# Patient Record
Sex: Female | Born: 1990 | Race: White | Hispanic: No | Marital: Married | Smoking: Former smoker
Health system: Southern US, Community
[De-identification: ages and names within clinical notes are randomized; demographics above are authoritative.]

## PROBLEM LIST (undated history)

## (undated) DIAGNOSIS — L409 Psoriasis, unspecified: Secondary | ICD-10-CM

## (undated) HISTORY — DX: Psoriasis, unspecified: L40.9

---
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
## 2014-06-17 ENCOUNTER — Encounter: Payer: Self-pay | Admitting: Adult Health

## 2014-06-17 ENCOUNTER — Ambulatory Visit (INDEPENDENT_AMBULATORY_CARE_PROVIDER_SITE_OTHER): Payer: 59 | Admitting: Adult Health

## 2014-06-17 DIAGNOSIS — Z3201 Encounter for pregnancy test, result positive: Secondary | ICD-10-CM

## 2014-06-17 LAB — POCT URINE PREGNANCY: Preg Test, Ur: POSITIVE

## 2014-06-18 ENCOUNTER — Other Ambulatory Visit: Payer: Self-pay | Admitting: Obstetrics and Gynecology

## 2014-06-18 DIAGNOSIS — O3680X0 Pregnancy with inconclusive fetal viability, not applicable or unspecified: Secondary | ICD-10-CM

## 2014-06-24 ENCOUNTER — Other Ambulatory Visit: Payer: 59

## 2014-06-30 ENCOUNTER — Ambulatory Visit (INDEPENDENT_AMBULATORY_CARE_PROVIDER_SITE_OTHER): Payer: 59

## 2014-06-30 DIAGNOSIS — O3680X0 Pregnancy with inconclusive fetal viability, not applicable or unspecified: Secondary | ICD-10-CM

## 2014-06-30 NOTE — Progress Notes (Signed)
U/S-single IUP with +FCA noted, FHR-157 bpm, CRL c/w 7+5wks, EDD 02/11/2015, cx appears closed, bilateral adnexa appears WNL, sub-chorionic hemorrhage 3.2 x 2.3 x 1.6cm noted in posterior uterus

## 2014-07-06 ENCOUNTER — Ambulatory Visit (INDEPENDENT_AMBULATORY_CARE_PROVIDER_SITE_OTHER): Payer: 59 | Admitting: Women's Health

## 2014-07-06 ENCOUNTER — Encounter: Payer: Self-pay | Admitting: Women's Health

## 2014-07-06 VITALS — BP 122/54 | Ht 62.0 in | Wt 138.0 lb

## 2014-07-06 DIAGNOSIS — Z6791 Unspecified blood type, Rh negative: Secondary | ICD-10-CM | POA: Insufficient documentation

## 2014-07-06 DIAGNOSIS — Z36 Encounter for antenatal screening of mother: Secondary | ICD-10-CM

## 2014-07-06 DIAGNOSIS — O26899 Other specified pregnancy related conditions, unspecified trimester: Secondary | ICD-10-CM

## 2014-07-06 DIAGNOSIS — O418X90 Other specified disorders of amniotic fluid and membranes, unspecified trimester, not applicable or unspecified: Secondary | ICD-10-CM | POA: Insufficient documentation

## 2014-07-06 DIAGNOSIS — Z3481 Encounter for supervision of other normal pregnancy, first trimester: Secondary | ICD-10-CM

## 2014-07-06 DIAGNOSIS — O360111 Maternal care for anti-D [Rh] antibodies, first trimester, fetus 1: Secondary | ICD-10-CM

## 2014-07-06 DIAGNOSIS — Z3480 Encounter for supervision of other normal pregnancy, unspecified trimester: Secondary | ICD-10-CM | POA: Insufficient documentation

## 2014-07-06 DIAGNOSIS — O468X9 Other antepartum hemorrhage, unspecified trimester: Secondary | ICD-10-CM | POA: Insufficient documentation

## 2014-07-06 DIAGNOSIS — Z1389 Encounter for screening for other disorder: Secondary | ICD-10-CM

## 2014-07-06 DIAGNOSIS — Z331 Pregnant state, incidental: Secondary | ICD-10-CM

## 2014-07-06 HISTORY — DX: Unspecified blood type, rh negative: Z67.91

## 2014-07-06 LAB — CBC
HCT: 40.3 % (ref 36.0–46.0)
Hemoglobin: 13.7 g/dL (ref 12.0–15.0)
MCH: 31.1 pg (ref 26.0–34.0)
MCHC: 34 g/dL (ref 30.0–36.0)
MCV: 91.4 fL (ref 78.0–100.0)
Platelets: 209 10*3/uL (ref 150–400)
RBC: 4.41 MIL/uL (ref 3.87–5.11)
RDW: 13 % (ref 11.5–15.5)
WBC: 7 10*3/uL (ref 4.0–10.5)

## 2014-07-06 LAB — POCT URINALYSIS DIPSTICK
GLUCOSE UA: NEGATIVE
Ketones, UA: NEGATIVE
LEUKOCYTES UA: NEGATIVE
Nitrite, UA: NEGATIVE
Protein, UA: NEGATIVE
RBC UA: NEGATIVE

## 2014-07-06 NOTE — Progress Notes (Signed)
  Subjective:  Alejandra Moody is a 23 y.o. 262P0010 Caucasian female at 3365w4d by 7.5wk u/s, being seen today for her first obstetrical visit.  Her obstetrical history is significant for recent SAB, Rh neg.  [facility] noted on 7wk u/s. Pregnancy history fully reviewed.  Patient reports mild nausea, no vomiting. Denies vb, cramping, uti s/s, abnormal/malodorous vag d/c, or vulvovaginal itching/irritation.  BP 122/54  Wt 138 lb (62.596 kg)  LMP 05/02/2014  HISTORY: OB History  Gravida Para Term Preterm AB SAB TAB Ectopic Multiple Living  2    1 1         # Outcome Date GA Lbr Len/2nd Weight Sex Delivery Anes PTL Lv  2 CUR           1 SAB 07/2013             Past Medical History  Diagnosis Date  . Psoriasis    Past Surgical History  Procedure Laterality Date  . Tonsillectomy and adenoidectomy  2004   Family History  Problem Relation Age of Onset  . Diabetes Paternal Grandmother   . Arthritis Maternal Grandmother     Exam   System:     General: Well developed & nourished, no acute distress   Skin: Warm & dry, normal coloration and turgor, no rashes   Neurologic: Alert & oriented, normal mood   Cardiovascular: Regular rate & rhythm   Respiratory: Effort & rate normal, LCTAB, acyanotic   Abdomen: Soft, non tender   Extremities: normal strength, tone   Pelvic Exam:    Perineum: Normal perineum   Vulva: Normal, no lesions   Vagina:  Normal mucosa, normal discharge   Cervix: Normal, bulbous, appears closed   Uterus: Normal size/shape/contour for GA   Thin prep pap smear Aug 2014 in [place], pt reports as normal  FHR: 160's via informal transabdominal u/s   Assessment:   Pregnancy: G2P0010 Patient Active Problem List   Diagnosis Date Noted  . Supervision of other normal pregnancy 07/06/2014    Priority: High  . 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 Rh  neg Recent SAB  Plan:  Initial labs drawn Continue prenatal vitamins Problem list reviewed and updated Reviewed n/v relief measures and warning s/s to report Reviewed recommended weight gain based on pre-gravid BMI Encouraged well-balanced diet Genetic Screening discussed Integrated Screen: requested Cystic fibrosis screening discussed requested Ultrasound discussed; fetal survey: requested Follow up in 4 weeks for 1st it/nt and low-risk ob [facility] completed Plans to apply for [facility] offered, accepted, referral sent  Marge DuncansBooker, Britanie Harshman Randall CNM, [facility]NP[facility]BC 07/06/2014 10:10 AM

## 2014-07-06 NOTE — Patient Instructions (Signed)
Nausea & Vomiting  Have saltine crackers or pretzels by your bed and eat a few bites before you raise your head out of bed in the morning  Eat small frequent meals throughout the day instead of large meals  Drink plenty of fluids throughout the day to stay hydrated, just don't drink a lot of fluids with your meals.  This can make your stomach fill up faster making you feel sick  Do not brush your teeth right after you eat  Products with real ginger are good for nausea, like ginger ale and ginger hard candy Make sure it says made with real ginger!  Sucking on sour candy like lemon heads is also good for nausea  If your prenatal vitamins make you nauseated, take them at night so you will sleep through the nausea  If you feel like you need medicine for the nausea & vomiting please let us know  If you are unable to keep any fluids or food down please let us know    Pregnancy - First Trimester During sexual intercourse, millions of sperm go into the vagina. Only 1 sperm will penetrate and fertilize the female egg while it is in the Fallopian tube. One week later, the fertilized egg implants into the wall of the uterus. An embryo begins to develop into a baby. At 6 to 8 weeks, the eyes and face are formed and the heartbeat can be seen on ultrasound. At the end of 12 weeks (first trimester), all the baby's organs are formed. Now that you are pregnant, you will want to do everything you can to have a healthy baby. Two of the most important things are to get good prenatal care and follow your caregiver's instructions. Prenatal care is all the medical care you receive before the baby's birth. It is given to prevent, find, and treat problems during the pregnancy and childbirth. PRENATAL EXAMS  During prenatal visits, your weight, blood pressure, and urine are checked. This is done to make sure you are healthy and progressing normally during the pregnancy.  A pregnant woman should gain 25 to 35 pounds  during the pregnancy. However, if you are overweight or underweight, your caregiver will advise you regarding your weight.  Your caregiver will ask and answer questions for you.  Blood work, cervical cultures, other necessary tests, and a Pap test are done during your prenatal exams. These tests are done to check on your health and the probable health of your baby. Tests are strongly recommended and done for HIV with your permission. This is the virus that causes AIDS. These tests are done because medicines can be given to help prevent your baby from being born with this infection should you have been infected without knowing it. Blood work is also used to find out your blood type, previous infections, and follow your blood levels (hemoglobin).  Low hemoglobin (anemia) is common during pregnancy. Iron and vitamins are given to help prevent this. Later in the pregnancy, blood tests for diabetes will be done along with any other tests if any problems develop.  You may need other tests to make sure you and the baby are doing well. CHANGES DURING THE FIRST TRIMESTER  Your body goes through many changes during pregnancy. They vary from person to person. Talk to your caregiver about changes you notice and are concerned about. Changes can include:  Your menstrual period stops.  The egg and sperm carry the genes that determine what you look like. Genes from you   and your partner are forming a baby. The female genes determine whether the baby is a boy or a girl.  Your body increases in girth and you may feel bloated.  Feeling sick to your stomach (nauseous) and throwing up (vomiting). If the vomiting is uncontrollable, call your caregiver.  Your breasts will begin to enlarge and become tender.  Your nipples may stick out more and become darker.  The need to urinate more. Painful urination may mean you have a bladder infection.  Tiring easily.  Loss of appetite.  Cravings for certain kinds of  food.  At first, you may gain or lose a couple of pounds.  You may have changes in your emotions from day to day (excited to be pregnant or concerned something may go wrong with the pregnancy and baby).  You may have more vivid and strange dreams. HOME CARE INSTRUCTIONS   It is very important to avoid all smoking, alcohol and non-prescribed drugs during your pregnancy. These affect the formation and growth of the baby. Avoid chemicals while pregnant to ensure the delivery of a healthy infant.  Start your prenatal visits by the 12th week of pregnancy. They are usually scheduled monthly at first, then more often in the last 2 months before delivery. Keep your caregiver's appointments. Follow your caregiver's instructions regarding medicine use, blood and lab tests, exercise, and diet.  During pregnancy, you are providing food for you and your baby. Eat regular, well-balanced meals. Choose foods such as meat, fish, milk and other low fat dairy products, vegetables, fruits, and whole-grain breads and cereals. Your caregiver will tell you of the ideal weight gain.  You can help morning sickness by keeping soda crackers at the bedside. Eat a couple before arising in the morning. You may want to use the crackers without salt on them.  Eating 4 to 5 small meals rather than 3 large meals a day also may help the nausea and vomiting.  Drinking liquids between meals instead of during meals also seems to help nausea and vomiting.  A physical sexual relationship may be continued throughout pregnancy if there are no other problems. Problems may be early (premature) leaking of amniotic fluid from the membranes, vaginal bleeding, or belly (abdominal) pain.  Exercise regularly if there are no restrictions. Check with your caregiver or physical therapist if you are unsure of the safety of some of your exercises. Greater weight gain will occur in the last 2 trimesters of pregnancy. Exercising will  help:  Control your weight.  Keep you in shape.  Prepare you for labor and delivery.  Help you lose your pregnancy weight after you deliver your baby.  Wear a good support or jogging bra for breast tenderness during pregnancy. This may help if worn during sleep too.  Ask when prenatal classes are available. Begin classes when they are offered.  Do not use hot tubs, steam rooms, or saunas.  Wear your seat belt when driving. This protects you and your baby if you are in an accident.  Avoid raw meat, uncooked cheese, cat litter boxes, and soil used by cats throughout the pregnancy. These carry germs that can cause birth defects in the baby.  The first trimester is a good time to visit your dentist for your dental health. Getting your teeth cleaned is okay. Use a softer toothbrush and brush gently during pregnancy.  Ask for help if you have financial, counseling, or nutritional needs during pregnancy. Your caregiver will be able to offer counseling for   these needs as well as refer you for other special needs.  Do not take any medicines or herbs unless told by your caregiver.  Inform your caregiver if there is any mental or physical domestic violence.  Make a list of emergency phone numbers of family, friends, hospital, and police and fire departments.  Write down your questions. Take them to your prenatal visit.  Do not douche.  Do not cross your legs.  If you have to stand for long periods of time, rotate you feet or take small steps in a circle.  You may have more vaginal secretions that may require a sanitary pad. Do not use tampons or scented sanitary pads. MEDICINES AND DRUG USE IN PREGNANCY  Take prenatal vitamins as directed. The vitamin should contain 1 milligram of folic acid. Keep all vitamins out of reach of children. Only a couple vitamins or tablets containing iron may be fatal to a baby or young child when ingested.  Avoid use of all medicines, including herbs,  over-the-counter medicines, not prescribed or suggested by your caregiver. Only take over-the-counter or prescription medicines for pain, discomfort, or fever as directed by your caregiver. Do not use aspirin, ibuprofen, or naproxen unless directed by your caregiver.  Let your caregiver also know about herbs you may be using.  Alcohol is related to a number of birth defects. This includes fetal alcohol syndrome. All alcohol, in any form, should be avoided completely. Smoking will cause low birth rate and premature babies.  Street or illegal drugs are very harmful to the baby. They are absolutely forbidden. A baby born to an addicted mother will be addicted at birth. The baby will go through the same withdrawal an adult does.  Let your caregiver know about any medicines that you have to take and for what reason you take them. SEEK MEDICAL CARE IF:  You have any concerns or worries during your pregnancy. It is better to call with your questions if you feel they cannot wait, rather than worry about them. SEEK IMMEDIATE MEDICAL CARE IF:   An unexplained oral temperature above 102 F (38.9 C) develops, or as your caregiver suggests.  You have leaking of fluid from the vagina (birth canal). If leaking membranes are suspected, take your temperature and inform your caregiver of this when you call.  There is vaginal spotting or bleeding. Notify your caregiver of the amount and how many pads are used.  You develop a bad smelling vaginal discharge with a change in the color.  You continue to feel sick to your stomach (nauseated) and have no relief from remedies suggested. You vomit blood or coffee ground-like materials.  You lose more than 2 pounds of weight in 1 week.  You gain more than 2 pounds of weight in 1 week and you notice swelling of your face, hands, feet, or legs.  You gain 5 pounds or more in 1 week (even if you do not have swelling of your hands, face, legs, or feet).  You get  exposed to German measles and have never had them.  You are exposed to fifth disease or chickenpox.  You develop belly (abdominal) pain. Round ligament discomfort is a common non-cancerous (benign) cause of abdominal pain in pregnancy. Your caregiver still must evaluate this.  You develop headache, fever, diarrhea, pain with urination, or shortness of breath.  You fall or are in a car accident or have any kind of trauma.  There is mental or physical violence in your home. Document   Released: 12/04/2001 Document Revised: 09/03/2012 Document Reviewed: 10/20/2013 ExitCare Patient Information 2015 [employer]. This information is not intended to replace advice given to you by your health care provider. Make sure you discuss any questions you have with your health care provider.  

## 2014-07-07 ENCOUNTER — Encounter: Payer: Self-pay | Admitting: Women's Health

## 2014-07-07 DIAGNOSIS — F1290 Cannabis use, unspecified, uncomplicated: Secondary | ICD-10-CM | POA: Insufficient documentation

## 2014-07-07 LAB — URINALYSIS, ROUTINE W REFLEX MICROSCOPIC
Bilirubin Urine: NEGATIVE
Glucose, UA: NEGATIVE mg/dL
Hgb urine dipstick: NEGATIVE
Ketones, ur: NEGATIVE mg/dL
Leukocytes, UA: NEGATIVE
NITRITE: NEGATIVE
PH: 6 (ref 5.0–8.0)
Protein, ur: NEGATIVE mg/dL
SPECIFIC GRAVITY, URINE: 1.023 (ref 1.005–1.030)
Urobilinogen, UA: 0.2 mg/dL (ref 0.0–1.0)

## 2014-07-07 LAB — DRUG SCREEN, URINE, NO CONFIRMATION
AMPHETAMINE SCRN UR: NEGATIVE
BENZODIAZEPINES.: NEGATIVE
Barbiturate Quant, Ur: NEGATIVE
COCAINE METABOLITES: NEGATIVE
Creatinine,U: 158.5 mg/dL
Marijuana Metabolite: POSITIVE — AB
Methadone: NEGATIVE
Opiate Screen, Urine: NEGATIVE
Phencyclidine (PCP): NEGATIVE
Propoxyphene: NEGATIVE

## 2014-07-07 LAB — HIV ANTIBODY (ROUTINE TESTING W REFLEX): HIV 1&2 Ab, 4th Generation: NONREACTIVE

## 2014-07-07 LAB — VARICELLA ZOSTER ANTIBODY, IGG: Varicella IgG: 430.9 Index — ABNORMAL HIGH (ref <135.00)

## 2014-07-07 LAB — OXYCODONE SCREEN, UA, RFLX CONFIRM: Oxycodone Screen, Ur: NEGATIVE ng/mL

## 2014-07-07 LAB — ABO AND RH: RH TYPE: NEGATIVE

## 2014-07-07 LAB — RPR

## 2014-07-07 LAB — RUBELLA SCREEN: Rubella: 2.73 Index — ABNORMAL HIGH (ref <0.90)

## 2014-07-07 LAB — ANTIBODY SCREEN: Antibody Screen: NEGATIVE

## 2014-07-07 LAB — HEPATITIS B SURFACE ANTIGEN: Hepatitis B Surface Ag: NEGATIVE

## 2014-07-08 LAB — URINE CULTURE
Colony Count: NO GROWTH
ORGANISM ID, BACTERIA: NO GROWTH

## 2014-07-09 LAB — CYSTIC FIBROSIS DIAGNOSTIC STUDY

## 2014-07-12 ENCOUNTER — Encounter: Payer: Self-pay | Admitting: Women's Health

## 2014-08-06 ENCOUNTER — Ambulatory Visit (INDEPENDENT_AMBULATORY_CARE_PROVIDER_SITE_OTHER): Payer: 59 | Admitting: Obstetrics and Gynecology

## 2014-08-06 ENCOUNTER — Encounter: Payer: Self-pay | Admitting: Obstetrics and Gynecology

## 2014-08-06 ENCOUNTER — Ambulatory Visit (INDEPENDENT_AMBULATORY_CARE_PROVIDER_SITE_OTHER): Payer: 59

## 2014-08-06 VITALS — BP 118/70 | Wt 140.2 lb

## 2014-08-06 DIAGNOSIS — Z36 Encounter for antenatal screening of mother: Secondary | ICD-10-CM

## 2014-08-06 DIAGNOSIS — Z1389 Encounter for screening for other disorder: Secondary | ICD-10-CM

## 2014-08-06 DIAGNOSIS — Z331 Pregnant state, incidental: Secondary | ICD-10-CM

## 2014-08-06 DIAGNOSIS — Z3480 Encounter for supervision of other normal pregnancy, unspecified trimester: Secondary | ICD-10-CM

## 2014-08-06 DIAGNOSIS — Z3481 Encounter for supervision of other normal pregnancy, first trimester: Secondary | ICD-10-CM

## 2014-08-06 LAB — POCT URINALYSIS DIPSTICK
Blood, UA: NEGATIVE
Glucose, UA: NEGATIVE
KETONES UA: NEGATIVE
LEUKOCYTES UA: NEGATIVE
NITRITE UA: NEGATIVE
Protein, UA: NEGATIVE

## 2014-08-06 NOTE — Patient Instructions (Signed)
Please check out [email or web address]   for more information on childbirth classes   

## 2014-08-06 NOTE — Progress Notes (Signed)
G2P0010 3871w0d Estimated Date of Delivery: 02/11/15  Blood pressure 118/70, weight 140 lb 3.2 oz (63.594 kg), last menstrual period 05/02/2014.   BP weight and urine results all reviewed and noted.  Please refer to the obstetrical flow sheet for the fundal height and fetal heart rate documentation:  Patient reports good fetal movement, denies any bleeding and no rupture of membranes symptoms or regular contractions. Patient is without complaints.All questions were answered.  Ob u/s reviewed with patient  Plan:  Continued routine obstetrical care,   Follow up in 4 weeks for OB appointment,

## 2014-08-06 NOTE — Progress Notes (Signed)
U/S(13+0wks)-single IUP with +FCA noted, FHR-161 bpm, NB present, NT-1.38mm, CRL c/w dates, cx appears closed (3.6cm), bilateral adnexa appears WNL, anterior Gr 0 placenta

## 2014-08-10 LAB — MATERNAL SCREEN, INTEGRATED #1

## 2014-09-06 ENCOUNTER — Ambulatory Visit (INDEPENDENT_AMBULATORY_CARE_PROVIDER_SITE_OTHER): Payer: 59 | Admitting: Women's Health

## 2014-09-06 ENCOUNTER — Encounter: Payer: Self-pay | Admitting: Women's Health

## 2014-09-06 VITALS — BP 108/56 | Wt 147.0 lb

## 2014-09-06 DIAGNOSIS — F1290 Cannabis use, unspecified, uncomplicated: Secondary | ICD-10-CM

## 2014-09-06 DIAGNOSIS — Z3480 Encounter for supervision of other normal pregnancy, unspecified trimester: Secondary | ICD-10-CM

## 2014-09-06 DIAGNOSIS — Z331 Pregnant state, incidental: Secondary | ICD-10-CM

## 2014-09-06 DIAGNOSIS — Z1159 Encounter for screening for other viral diseases: Secondary | ICD-10-CM

## 2014-09-06 DIAGNOSIS — Z1389 Encounter for screening for other disorder: Secondary | ICD-10-CM

## 2014-09-06 DIAGNOSIS — Z36 Encounter for antenatal screening of mother: Secondary | ICD-10-CM

## 2014-09-06 DIAGNOSIS — Z3482 Encounter for supervision of other normal pregnancy, second trimester: Secondary | ICD-10-CM

## 2014-09-06 LAB — POCT URINALYSIS DIPSTICK
Blood, UA: NEGATIVE
Glucose, UA: NEGATIVE
KETONES UA: NEGATIVE
Leukocytes, UA: NEGATIVE
Nitrite, UA: NEGATIVE
Protein, UA: NEGATIVE

## 2014-09-06 NOTE — Progress Notes (Signed)
Low-risk OB appointment G2P0010 [redacted]w[redacted]d Estimated Date of Delivery: 02/11/15 BP 108/56  Wt 147 lb (66.679 kg)  LMP 05/02/2014  BP, weight, and urine reviewed.  Refer to obstetrical flow sheet for FH & FHR.  No fm yet. Denies cramping, lof, vb, or uti s/s. No complaints. No THC in >30d, will recheck today.  Reviewed warning s/s to report. Plan:  Continue routine obstetrical care  F/U in 3wks for OB appointment and anatomy u/s 2nd IT today

## 2014-09-06 NOTE — Addendum Note (Signed)
Addended by: Gaylyn Rong A on: 09/06/2014 12:21 PM   Modules accepted: Orders

## 2014-09-06 NOTE — Patient Instructions (Signed)
Second Trimester of Pregnancy The second trimester is from week 13 through week 28, months 4 through 6. The second trimester is often a time when you feel your best. Your body has also adjusted to being pregnant, and you begin to feel better physically. Usually, morning sickness has lessened or quit completely, you may have more energy, and you may have an increase in appetite. The second trimester is also a time when the fetus is growing rapidly. At the end of the sixth month, the fetus is about 9 inches long and weighs about 1 pounds. You will likely begin to feel the baby move (quickening) between 18 and 20 weeks of the pregnancy. BODY CHANGES Your body goes through many changes during pregnancy. The changes vary from woman to woman.   Your weight will continue to increase. You will notice your lower abdomen bulging out.  You may begin to get stretch marks on your hips, abdomen, and breasts.  You may develop headaches that can be relieved by medicines approved by your health care provider.  You may urinate more often because the fetus is pressing on your bladder.  You may develop or continue to have heartburn as a result of your pregnancy.  You may develop constipation because certain hormones are causing the muscles that push waste through your intestines to slow down.  You may develop hemorrhoids or swollen, bulging veins (varicose veins).  You may have back pain because of the weight gain and pregnancy hormones relaxing your joints between the bones in your pelvis and as a result of a shift in weight and the muscles that support your balance.  Your breasts will continue to grow and be tender.  Your gums may bleed and may be sensitive to brushing and flossing.  Dark spots or blotches (chloasma, mask of pregnancy) may develop on your face. This will likely fade after the baby is born.  A dark line from your belly button to the pubic area (linea nigra) may appear. This will likely fade  after the baby is born.  You may have changes in your hair. These can include thickening of your hair, rapid growth, and changes in texture. Some women also have hair loss during or after pregnancy, or hair that feels dry or thin. Your hair will most likely return to normal after your baby is born. WHAT TO EXPECT AT YOUR PRENATAL VISITS During a routine prenatal visit:  You will be weighed to make sure you and the fetus are growing normally.  Your blood pressure will be taken.  Your abdomen will be measured to track your baby's growth.  The fetal heartbeat will be listened to.  Any test results from the previous visit will be discussed. Your health care provider may ask you:  How you are feeling.  If you are feeling the baby move.  If you have had any abnormal symptoms, such as leaking fluid, bleeding, severe headaches, or abdominal cramping.  If you have any questions. Other tests that may be performed during your second trimester include:  Blood tests that check for:  Low iron levels (anemia).  Gestational diabetes (between 24 and 28 weeks).  Rh antibodies.  Urine tests to check for infections, diabetes, or protein in the urine.  An ultrasound to confirm the proper growth and development of the baby.  An amniocentesis to check for possible genetic problems.  Fetal screens for spina bifida and Down syndrome. HOME CARE INSTRUCTIONS   Avoid all smoking, herbs, alcohol, and unprescribed   drugs. These chemicals affect the formation and growth of the baby.  Follow your health care provider's instructions regarding medicine use. There are medicines that are either safe or unsafe to take during pregnancy.  Exercise only as directed by your health care provider. Experiencing uterine cramps is a good sign to stop exercising.  Continue to eat regular, healthy meals.  Wear a good support bra for breast tenderness.  Do not use hot tubs, steam rooms, or saunas.  Wear your  seat belt at all times when driving.  Avoid raw meat, uncooked cheese, cat litter boxes, and soil used by cats. These carry germs that can cause birth defects in the baby.  Take your prenatal vitamins.  Try taking a stool softener (if your health care provider approves) if you develop constipation. Eat more high-fiber foods, such as fresh vegetables or fruit and whole grains. Drink plenty of fluids to keep your urine clear or pale yellow.  Take warm sitz baths to soothe any pain or discomfort caused by hemorrhoids. Use hemorrhoid cream if your health care provider approves.  If you develop varicose veins, wear support hose. Elevate your feet for 15 minutes, 3-4 times a day. Limit salt in your diet.  Avoid heavy lifting, wear low heel shoes, and practice good posture.  Rest with your legs elevated if you have leg cramps or low back pain.  Visit your dentist if you have not gone yet during your pregnancy. Use a soft toothbrush to brush your teeth and be gentle when you floss.  A sexual relationship may be continued unless your health care provider directs you otherwise.  Continue to go to all your prenatal visits as directed by your health care provider. SEEK MEDICAL CARE IF:   You have dizziness.  You have mild pelvic cramps, pelvic pressure, or nagging pain in the abdominal area.  You have persistent nausea, vomiting, or diarrhea.  You have a bad smelling vaginal discharge.  You have pain with urination. SEEK IMMEDIATE MEDICAL CARE IF:   You have a fever.  You are leaking fluid from your vagina.  You have spotting or bleeding from your vagina.  You have severe abdominal cramping or pain.  You have rapid weight gain or loss.  You have shortness of breath with chest pain.  You notice sudden or extreme swelling of your face, hands, ankles, feet, or legs.  You have not felt your baby move in over an hour.  You have severe headaches that do not go away with  medicine.  You have vision changes. Document Released: 12/04/2001 Document Revised: 12/15/2013 Document Reviewed: 02/10/2013 Exit[employer] Patient Information 2015 [employer]. This information is not intended to replace advice given to you by your health care provider. Make sure you discuss any questions you have with your health care provider.  

## 2014-09-07 ENCOUNTER — Encounter: Payer: Self-pay | Admitting: Women's Health

## 2014-09-07 LAB — DRUG SCREEN, URINE, NO CONFIRMATION
Amphetamine Screen, Ur: NEGATIVE
Barbiturate Quant, Ur: NEGATIVE
Benzodiazepines.: NEGATIVE
CREATININE, U: 98 mg/dL
Cocaine Metabolites: NEGATIVE
MARIJUANA METABOLITE: POSITIVE — AB
Methadone: NEGATIVE
Opiate Screen, Urine: NEGATIVE
PHENCYCLIDINE (PCP): NEGATIVE
Propoxyphene: NEGATIVE

## 2014-09-07 LAB — GC/CHLAMYDIA PROBE AMP
CT Probe RNA: NEGATIVE
GC Probe RNA: NEGATIVE

## 2014-09-10 LAB — MATERNAL SCREEN, INTEGRATED #2
AFP MoM: 1.35
AFP, Serum: 55.2 ng/mL
CALCULATED GESTATIONAL AGE MAT SCREEN: 17.6
Crown Rump Length: 71.7 mm
ESTRIOL FREE MAT SCREEN: 0.95 ng/mL
Estriol Mom: 0.77
HCG, MOM MAT SCREEN: 3.11
INHIBIN A DIMERIC MAT SCREEN: 746 pg/mL
INHIBIN A MOM MAT SCREEN: 4.47
MSS Down Syndrome: 1:310 {titer}
MSS Trisomy 18 Risk: <1:5000 {titer}
NT MoM: 1.08
Nuchal Translucency: 1.71 mm
Number of fetuses: 1
PAPP-A MoM: 0.78
PAPP-A: 626 ng/mL
hCG, Serum: 82.1 IU/mL

## 2014-09-13 ENCOUNTER — Encounter: Payer: Self-pay | Admitting: Women's Health

## 2014-09-27 ENCOUNTER — Ambulatory Visit (INDEPENDENT_AMBULATORY_CARE_PROVIDER_SITE_OTHER): Payer: 59

## 2014-09-27 ENCOUNTER — Encounter: Payer: Self-pay | Admitting: Women's Health

## 2014-09-27 ENCOUNTER — Ambulatory Visit (INDEPENDENT_AMBULATORY_CARE_PROVIDER_SITE_OTHER): Payer: 59 | Admitting: Women's Health

## 2014-09-27 ENCOUNTER — Other Ambulatory Visit: Payer: Self-pay | Admitting: Women's Health

## 2014-09-27 VITALS — BP 112/64 | Wt 151.0 lb

## 2014-09-27 DIAGNOSIS — Z23 Encounter for immunization: Secondary | ICD-10-CM

## 2014-09-27 DIAGNOSIS — Z3482 Encounter for supervision of other normal pregnancy, second trimester: Secondary | ICD-10-CM

## 2014-09-27 DIAGNOSIS — O09292 Supervision of pregnancy with other poor reproductive or obstetric history, second trimester: Secondary | ICD-10-CM

## 2014-09-27 DIAGNOSIS — Z1389 Encounter for screening for other disorder: Secondary | ICD-10-CM

## 2014-09-27 LAB — POCT URINALYSIS DIPSTICK
Blood, UA: NEGATIVE
Glucose, UA: NEGATIVE
Ketones, UA: NEGATIVE
Leukocytes, UA: NEGATIVE
Nitrite, UA: NEGATIVE
Protein, UA: NEGATIVE

## 2014-09-27 NOTE — Progress Notes (Signed)
Low-risk OB appointment G2P0010 7377w3d Estimated Date of Delivery: 02/11/15 BP 112/64  Wt 151 lb (68.493 kg)  LMP 05/02/2014  BP, weight, and urine reviewed.  Refer to obstetrical flow sheet for FH & FHR.  Reports some fm, has anterior placenta.  Denies regular uc's, lof, vb, or uti s/s. No complaints. Reviewed today's normal anatomy u/s, ptl s/s, fm. Plan:  Continue routine obstetrical care  F/U in 4wks for OB appointment  Flu shot today

## 2014-09-27 NOTE — Patient Instructions (Signed)
Second Trimester of Pregnancy The second trimester is from week 13 through week 28, months 4 through 6. The second trimester is often a time when you feel your best. Your body has also adjusted to being pregnant, and you begin to feel better physically. Usually, morning sickness has lessened or quit completely, you may have more energy, and you may have an increase in appetite. The second trimester is also a time when the fetus is growing rapidly. At the end of the sixth month, the fetus is about 9 inches long and weighs about 1 pounds. You will likely begin to feel the baby move (quickening) between 18 and 20 weeks of the pregnancy. BODY CHANGES Your body goes through many changes during pregnancy. The changes vary from woman to woman.   Your weight will continue to increase. You will notice your lower abdomen bulging out.  You may begin to get stretch marks on your hips, abdomen, and breasts.  You may develop headaches that can be relieved by medicines approved by your health care provider.  You may urinate more often because the fetus is pressing on your bladder.  You may develop or continue to have heartburn as a result of your pregnancy.  You may develop constipation because certain hormones are causing the muscles that push waste through your intestines to slow down.  You may develop hemorrhoids or swollen, bulging veins (varicose veins).  You may have back pain because of the weight gain and pregnancy hormones relaxing your joints between the bones in your pelvis and as a result of a shift in weight and the muscles that support your balance.  Your breasts will continue to grow and be tender.  Your gums may bleed and may be sensitive to brushing and flossing.  Dark spots or blotches (chloasma, mask of pregnancy) may develop on your face. This will likely fade after the baby is born.  A dark line from your belly button to the pubic area (linea nigra) may appear. This will likely fade  after the baby is born.  You may have changes in your hair. These can include thickening of your hair, rapid growth, and changes in texture. Some women also have hair loss during or after pregnancy, or hair that feels dry or thin. Your hair will most likely return to normal after your baby is born. WHAT TO EXPECT AT YOUR PRENATAL VISITS During a routine prenatal visit:  You will be weighed to make sure you and the fetus are growing normally.  Your blood pressure will be taken.  Your abdomen will be measured to track your baby's growth.  The fetal heartbeat will be listened to.  Any test results from the previous visit will be discussed. Your health care provider may ask you:  How you are feeling.  If you are feeling the baby move.  If you have had any abnormal symptoms, such as leaking fluid, bleeding, severe headaches, or abdominal cramping.  If you have any questions. Other tests that may be performed during your second trimester include:  Blood tests that check for:  Low iron levels (anemia).  Gestational diabetes (between 24 and 28 weeks).  Rh antibodies.  Urine tests to check for infections, diabetes, or protein in the urine.  An ultrasound to confirm the proper growth and development of the baby.  An amniocentesis to check for possible genetic problems.  Fetal screens for spina bifida and Down syndrome. HOME CARE INSTRUCTIONS   Avoid all smoking, herbs, alcohol, and unprescribed   drugs. These chemicals affect the formation and growth of the baby.  Follow your health care provider's instructions regarding medicine use. There are medicines that are either safe or unsafe to take during pregnancy.  Exercise only as directed by your health care provider. Experiencing uterine cramps is a good sign to stop exercising.  Continue to eat regular, healthy meals.  Wear a good support bra for breast tenderness.  Do not use hot tubs, steam rooms, or saunas.  Wear your  seat belt at all times when driving.  Avoid raw meat, uncooked cheese, cat litter boxes, and soil used by cats. These carry germs that can cause birth defects in the baby.  Take your prenatal vitamins.  Try taking a stool softener (if your health care provider approves) if you develop constipation. Eat more high-fiber foods, such as fresh vegetables or fruit and whole grains. Drink plenty of fluids to keep your urine clear or pale yellow.  Take warm sitz baths to soothe any pain or discomfort caused by hemorrhoids. Use hemorrhoid cream if your health care provider approves.  If you develop varicose veins, wear support hose. Elevate your feet for 15 minutes, 3-4 times a day. Limit salt in your diet.  Avoid heavy lifting, wear low heel shoes, and practice good posture.  Rest with your legs elevated if you have leg cramps or low back pain.  Visit your dentist if you have not gone yet during your pregnancy. Use a soft toothbrush to brush your teeth and be gentle when you floss.  A sexual relationship may be continued unless your health care provider directs you otherwise.  Continue to go to all your prenatal visits as directed by your health care provider. SEEK MEDICAL CARE IF:   You have dizziness.  You have mild pelvic cramps, pelvic pressure, or nagging pain in the abdominal area.  You have persistent nausea, vomiting, or diarrhea.  You have a bad smelling vaginal discharge.  You have pain with urination. SEEK IMMEDIATE MEDICAL CARE IF:   You have a fever.  You are leaking fluid from your vagina.  You have spotting or bleeding from your vagina.  You have severe abdominal cramping or pain.  You have rapid weight gain or loss.  You have shortness of breath with chest pain.  You notice sudden or extreme swelling of your face, hands, ankles, feet, or legs.  You have not felt your baby move in over an hour.  You have severe headaches that do not go away with  medicine.  You have vision changes. Document Released: 12/04/2001 Document Revised: 12/15/2013 Document Reviewed: 02/10/2013 [facility] Patient Information 2015 [place], [place]. This information is not intended to replace advice given to you by your health care provider. Make sure you discuss any questions you have with your health care provider.  Influenza Vaccine (Flu Vaccine, Inactivated or Recombinant) 2014-2015: What You Need to Know 1. Why get vaccinated? Influenza ("flu") is a contagious disease that spreads around the [place] every winter, usually between October and May. Flu is caused by influenza viruses, and is spread mainly by coughing, sneezing, and close contact. Anyone can get flu, but the risk of getting flu is highest among children. Symptoms come on suddenly and may last several days. They can include:  fever/chills  sore throat  muscle aches  fatigue  cough  headache  runny or stuffy nose Flu can make some people much sicker than others. These people include young children, people 3165 and older, pregnant women,  and people with certain health conditions-such as heart, lung or kidney disease, nervous system disorders, or a weakened immune system. Flu vaccination is especially important for these people, and anyone in close contact with them. Flu can also lead to pneumonia, and make existing medical conditions worse. It can cause diarrhea and seizures in children. Each year thousands of people in the [place] die from flu, and many more are hospitalized. Flu vaccine is the best protection against flu and its complications. Flu vaccine also helps prevent spreading flu from person to person. 2. Inactivated and recombinant flu vaccines You are getting an injectable flu vaccine, which is either an "inactivated" or "recombinant" vaccine. These vaccines do not contain any live influenza virus. They are given by injection with a needle, and often called the "flu shot."   A different live, attenuated (weakened) influenza vaccine is sprayed into the nostrils. This vaccine is described in a separate Vaccine Information Statement. Flu vaccination is recommended every year. Some children 6 months through 48 years of age might need two doses during one year. Flu viruses are always changing. Each year's flu vaccine is made to protect against 3 or 4 viruses that are likely to cause disease that year. Flu vaccine cannot prevent all cases of flu, but it is the best defense against the disease.  It takes about 2 weeks for protection to develop after the vaccination, and protection lasts several months to a year. Some illnesses that are not caused by influenza virus are often mistaken for flu. Flu vaccine will not prevent these illnesses. It can only prevent influenza. Some inactivated flu vaccine contains a very small amount of a mercury-based preservative called thimerosal. Studies have shown that thimerosal in vaccines is not harmful, but flu vaccines that do not contain a preservative are available. 3. Some people should not get this vaccine Tell the person who gives you the vaccine:  If you have any severe, life-threatening allergies. If you ever had a life-threatening allergic reaction after a dose of flu vaccine, or have a severe allergy to any part of this vaccine, including (for example) an allergy to gelatin, antibiotics, or eggs, you may be advised not to get vaccinated. Most, but not all, types of flu vaccine contain a small amount of egg protein.  If you ever had Guillain-Barr Syndrome (a severe paralyzing illness, also called GBS). Some people with a history of GBS should not get this vaccine. This should be discussed with your doctor.  If you are not feeling well. It is usually okay to get flu vaccine when you have a mild illness, but you might be advised to wait until you feel better. You should come back when you are better. 4. Risks of a vaccine reaction With  a vaccine, like any medicine, there is a chance of side effects. These are usually mild and go away on their own. Problems that could happen after any vaccine:  Brief fainting spells can happen after any medical procedure, including vaccination. Sitting or lying down for about 15 minutes can help prevent fainting, and injuries caused by a fall. Tell your doctor if you feel dizzy, or have vision changes or ringing in the ears.  Severe shoulder pain and reduced range of motion in the arm where a shot was given can happen, very rarely, after a vaccination.  Severe allergic reactions from a vaccine are very rare, estimated at less than 1 in a million doses. If one were to occur, it would usually be  within a few minutes to a few hours after the vaccination. Mild problems following inactivated flu vaccine:  soreness, redness, or swelling where the shot was given  hoarseness  sore, red or itchy eyes  cough  fever  aches  headache  itching  fatigue If these problems occur, they usually begin soon after the shot and last 1 or 2 days. Moderate problems following inactivated flu vaccine:  Young children who get inactivated flu vaccine and pneumococcal vaccine (PCV13) at the same time may be at increased risk for seizures caused by fever. Ask your doctor for more information. Tell your doctor if a child who is getting flu vaccine has ever had a seizure. Inactivated flu vaccine does not contain live flu virus, so you cannot get the flu from this vaccine. As with any medicine, there is a very remote chance of a vaccine causing a serious injury or death. The safety of vaccines is always being monitored. For more information, visit: http://floyd.org/ 5. What if there is a serious reaction? What should I look for?  Look for anything that concerns you, such as signs of a severe allergic reaction, very high fever, or behavior changes. Signs of a severe allergic reaction can include hives,  swelling of the face and throat, difficulty breathing, a fast heartbeat, dizziness, and weakness. These would start a few minutes to a few hours after the vaccination. What should I do?  If you think it is a severe allergic reaction or other emergency that can't wait, call 9-1-1 and get the person to the nearest hospital. Otherwise, call your doctor.  Afterward, the reaction should be reported to the Vaccine Adverse Event Reporting System (VAERS). Your doctor should file this report, or you can do it yourself through the VAERS web site at www.vaers.LAgents.no, or by calling 1-832-771-3265. VAERS does not give medical advice. 6. The National Vaccine Injury Compensation Program The [employer] Vaccine Injury Compensation Program (VICP) is a federal program that was created to compensate people who may have been injured by certain vaccines. Persons who believe they may have been injured by a vaccine can learn about the program and about filing a claim by calling 1-703-692-5060 or visiting the VICP website at SpiritualWord.at. There is a time limit to file a claim for compensation. 7. How can I learn more?  Ask your health care provider.  Call your local or state health department.  Contact the Centers for Disease Control and Prevention (CDC):  Call (678)462-5847 (1-800-CDC-INFO) or  Visit CDC's website at BiotechRoom.com.cy CDC Vaccine Information Statement (Interim) Inactivated Influenza Vaccine (08/11/2013) Document Released: 10/04/2006 Document Revised: 04/26/2014 Document Reviewed: 11/27/2013 [facility] Patient Information 2015 [place], [place]. This information is not intended to replace advice given to you by your health care provider. Make sure you discuss any questions you have with your health care provider.

## 2014-09-27 NOTE — Progress Notes (Signed)
U/S(20+3wks)-active fetus, meas c/w dates, fluid WNL, anterior Gr 0 placenta, cx appears closed (3.6cm), bilateral adnexa appears WNL, FHR-154bpm, female fetus, no major abnl noted

## 2014-10-25 ENCOUNTER — Encounter: Payer: Self-pay | Admitting: Women's Health

## 2014-10-25 ENCOUNTER — Ambulatory Visit (INDEPENDENT_AMBULATORY_CARE_PROVIDER_SITE_OTHER): Payer: 59 | Admitting: Women's Health

## 2014-10-25 VITALS — BP 112/64 | Wt 163.0 lb

## 2014-10-25 DIAGNOSIS — Z1389 Encounter for screening for other disorder: Secondary | ICD-10-CM

## 2014-10-25 DIAGNOSIS — Z331 Pregnant state, incidental: Secondary | ICD-10-CM

## 2014-10-25 DIAGNOSIS — Z3492 Encounter for supervision of normal pregnancy, unspecified, second trimester: Secondary | ICD-10-CM

## 2014-10-25 LAB — POCT URINALYSIS DIPSTICK
GLUCOSE UA: NEGATIVE
Ketones, UA: NEGATIVE
Leukocytes, UA: NEGATIVE
NITRITE UA: NEGATIVE
Protein, UA: NEGATIVE
RBC UA: NEGATIVE

## 2014-10-25 NOTE — Patient Instructions (Signed)
You will have your sugar test next visit.  Please do not eat or drink anything after midnight the night before you come, not even water.  You will be here for at least two hours.     Call the office [phone]) or go to [facility] if:  You begin to have strong, frequent contractions  Your water breaks.  Sometimes it is a big gush of fluid, sometimes it is just a trickle that keeps getting your panties wet or running down your legs  You have vaginal bleeding.  It is normal to have a small amount of spotting if your cervix was checked.   You don't feel your baby moving like normal.  If you don't, get you something to eat and drink and lay down and focus on feeling your baby move.  You should feel at least 10 movements in 2 hours.  If you don't, you should call the office or go to [facility].    Second Trimester of Pregnancy The second trimester is from week 13 through week 28, months 4 through 6. The second trimester is often a time when you feel your best. Your body has also adjusted to being pregnant, and you begin to feel better physically. Usually, morning sickness has lessened or quit completely, you may have more energy, and you may have an increase in appetite. The second trimester is also a time when the fetus is growing rapidly. At the end of the sixth month, the fetus is about 9 inches long and weighs about 1 pounds. You will likely begin to feel the baby move (quickening) between 18 and 20 weeks of the pregnancy. BODY CHANGES Your body goes through many changes during pregnancy. The changes vary from woman to woman.  5. Your weight will continue to increase. You will notice your lower abdomen bulging out. 6. You may begin to get stretch marks on your hips, abdomen, and breasts. 7. You may develop headaches that can be relieved by medicines approved by your health care provider. 8. You may urinate more often because the fetus is pressing on your bladder. 9. You may develop or  continue to have heartburn as a result of your pregnancy. 10. You may develop constipation because certain hormones are causing the muscles that push waste through your intestines to slow down. 11. You may develop hemorrhoids or swollen, bulging veins (varicose veins). 12. You may have back pain because of the weight gain and pregnancy hormones relaxing your joints between the bones in your pelvis and as a result of a shift in weight and the muscles that support your balance. 13. Your breasts will continue to grow and be tender. 14. Your gums may bleed and may be sensitive to brushing and flossing. 15. Dark spots or blotches (chloasma, mask of pregnancy) may develop on your face. This will likely fade after the baby is born. 16. A dark line from your belly button to the pubic area (linea nigra) may appear. This will likely fade after the baby is born. 17. You may have changes in your hair. These can include thickening of your hair, rapid growth, and changes in texture. Some women also have hair loss during or after pregnancy, or hair that feels dry or thin. Your hair will most likely return to normal after your baby is born. WHAT TO EXPECT AT YOUR PRENATAL VISITS During a routine prenatal visit:  You will be weighed to make sure you and the fetus are growing normally.  Your  blood pressure will be taken.  Your abdomen will be measured to track your baby's growth.  The fetal heartbeat will be listened to.  Any test results from the previous visit will be discussed. Your health care provider may ask you:  How you are feeling.  If you are feeling the baby move.  If you have had any abnormal symptoms, such as leaking fluid, bleeding, severe headaches, or abdominal cramping.  If you have any questions. Other tests that may be performed during your second trimester include:  Blood tests that check for:  Low iron levels (anemia).  Gestational diabetes (between 24 and 28 weeks).  Rh  antibodies.  Urine tests to check for infections, diabetes, or protein in the urine.  An ultrasound to confirm the proper growth and development of the baby.  An amniocentesis to check for possible genetic problems.  Fetal screens for spina bifida and Down syndrome. HOME CARE INSTRUCTIONS   Avoid all smoking, herbs, alcohol, and unprescribed drugs. These chemicals affect the formation and growth of the baby.  Follow your health care provider's instructions regarding medicine use. There are medicines that are either safe or unsafe to take during pregnancy.  Exercise only as directed by your health care provider. Experiencing uterine cramps is a good sign to stop exercising.  Continue to eat regular, healthy meals.  Wear a good support bra for breast tenderness.  Do not use hot tubs, steam rooms, or saunas.  Wear your seat belt at all times when driving.  Avoid raw meat, uncooked cheese, cat litter boxes, and soil used by cats. These carry germs that can cause birth defects in the baby.  Take your prenatal vitamins.  Try taking a stool softener (if your health care provider approves) if you develop constipation. Eat more high-fiber foods, such as fresh vegetables or fruit and whole grains. Drink plenty of fluids to keep your urine clear or pale yellow.  Take warm sitz baths to soothe any pain or discomfort caused by hemorrhoids. Use hemorrhoid cream if your health care provider approves.  If you develop varicose veins, wear support hose. Elevate your feet for 15 minutes, 3-4 times a day. Limit salt in your diet.  Avoid heavy lifting, wear low heel shoes, and practice good posture.  Rest with your legs elevated if you have leg cramps or low back pain.  Visit your dentist if you have not gone yet during your pregnancy. Use a soft toothbrush to brush your teeth and be gentle when you floss.  A sexual relationship may be continued unless your health care provider directs you  otherwise.  Continue to go to all your prenatal visits as directed by your health care provider. SEEK MEDICAL CARE IF:   You have dizziness.  You have mild pelvic cramps, pelvic pressure, or nagging pain in the abdominal area.  You have persistent nausea, vomiting, or diarrhea.  You have a bad smelling vaginal discharge.  You have pain with urination. SEEK IMMEDIATE MEDICAL CARE IF:   You have a fever.  You are leaking fluid from your vagina.  You have spotting or bleeding from your vagina.  You have severe abdominal cramping or pain.  You have rapid weight gain or loss.  You have shortness of breath with chest pain.  You notice sudden or extreme swelling of your face, hands, ankles, feet, or legs.  You have not felt your baby move in over an hour.  You have severe headaches that do not go away with  medicine.  You have vision changes. Document Released: 12/04/2001 Document Revised: 12/15/2013 Document Reviewed: 02/10/2013 [facility] Patient Information 2015 [place], [place]. This information is not intended to replace advice given to you by your health care provider. Make sure you discuss any questions you have with your health care provider.

## 2014-10-25 NOTE — Progress Notes (Signed)
Low-risk OB appointment [id number] Estimated Date of Delivery: 02/11/15 BP 112/64 mmHg  Wt 163 lb (73.936 kg)  LMP 05/02/2014  BP, weight, and urine reviewed.  Refer to obstetrical flow sheet for FH & FHR.  Reports good fm.  Denies regular uc's, lof, vb, or uti s/s. No complaints. [place]  Reviewed ptl s/s, fm. Plan:  Continue routine obstetrical care F/U in 4wks for OB appointment and pn2

## 2014-10-26 ENCOUNTER — Encounter: Payer: Self-pay | Admitting: Women's Health

## 2014-11-22 ENCOUNTER — Ambulatory Visit (INDEPENDENT_AMBULATORY_CARE_PROVIDER_SITE_OTHER): Payer: 59 | Admitting: Women's Health

## 2014-11-22 ENCOUNTER — Other Ambulatory Visit: Payer: 59

## 2014-11-22 ENCOUNTER — Encounter: Payer: Self-pay | Admitting: Women's Health

## 2014-11-22 VITALS — BP 104/64 | Wt 170.0 lb

## 2014-11-22 DIAGNOSIS — F1290 Cannabis use, unspecified, uncomplicated: Secondary | ICD-10-CM

## 2014-11-22 DIAGNOSIS — Z0184 Encounter for antibody response examination: Secondary | ICD-10-CM

## 2014-11-22 DIAGNOSIS — Z331 Pregnant state, incidental: Secondary | ICD-10-CM

## 2014-11-22 DIAGNOSIS — O418X90 Other specified disorders of amniotic fluid and membranes, unspecified trimester, not applicable or unspecified: Secondary | ICD-10-CM

## 2014-11-22 DIAGNOSIS — Z3483 Encounter for supervision of other normal pregnancy, third trimester: Secondary | ICD-10-CM

## 2014-11-22 DIAGNOSIS — Z114 Encounter for screening for human immunodeficiency virus [HIV]: Secondary | ICD-10-CM

## 2014-11-22 DIAGNOSIS — Z113 Encounter for screening for infections with a predominantly sexual mode of transmission: Secondary | ICD-10-CM

## 2014-11-22 DIAGNOSIS — Z1389 Encounter for screening for other disorder: Secondary | ICD-10-CM

## 2014-11-22 DIAGNOSIS — Z131 Encounter for screening for diabetes mellitus: Secondary | ICD-10-CM

## 2014-11-22 DIAGNOSIS — O468X9 Other antepartum hemorrhage, unspecified trimester: Secondary | ICD-10-CM

## 2014-11-22 LAB — CBC
HCT: 36.2 % (ref 36.0–46.0)
Hemoglobin: 12.2 g/dL (ref 12.0–15.0)
MCH: 30.9 pg (ref 26.0–34.0)
MCHC: 33.7 g/dL (ref 30.0–36.0)
MCV: 91.6 fL (ref 78.0–100.0)
MPV: 10.5 fL (ref 9.4–12.4)
Platelets: 181 10*3/uL (ref 150–400)
RBC: 3.95 MIL/uL (ref 3.87–5.11)
RDW: 12.8 % (ref 11.5–15.5)
WBC: 9.5 10*3/uL (ref 4.0–10.5)

## 2014-11-22 LAB — POCT URINALYSIS DIPSTICK
Blood, UA: NEGATIVE
Glucose, UA: NEGATIVE
Ketones, UA: NEGATIVE
Nitrite, UA: NEGATIVE
Protein, UA: NEGATIVE

## 2014-11-22 NOTE — Progress Notes (Signed)
Low-risk OB appointment [id number] Estimated Date of Delivery: 02/11/15 BP 104/64 mmHg  Wt 170 lb (77.111 kg)  LMP 05/02/2014  BP, weight, and urine reviewed.  Refer to obstetrical flow sheet for FH & FHR.  Reports good fm.  Denies regular uc's, lof, vb, or uti s/s. No complaints. No THC in >30d, will retest today.  Reviewed ptl s/s, fkc. Recommended Tdap at HD/PCP per CDC guidelines.  Plan:  Continue routine obstetrical care  F/U in 4wks for OB appointment  PN2 today

## 2014-11-22 NOTE — Patient Instructions (Addendum)
Call the office ([phone]) or go to [facility] if:  You begin to have strong, frequent contractions  Your water breaks.  Sometimes it is a big gush of fluid, sometimes it is just a trickle that keeps getting your panties wet or running down your legs  You have vaginal bleeding.  It is normal to have a small amount of spotting if your cervix was checked.   You don't feel your baby moving like normal.  If you don't, get you something to eat and drink and lay down and focus on feeling your baby move.  You should feel at least 10 movements in 2 hours.  If you don't, you should call the office or go to [facility].    Tdap Vaccine  It is recommended that you get the Tdap vaccine during the third trimester of EACH pregnancy to help protect your baby from getting pertussis (whooping cough)  27-36 weeks is the BEST time to do this so that you can pass the protection on to your baby. During pregnancy is better than after pregnancy, but if you are unable to get it during pregnancy it will be offered at the hospital.   You can get this vaccine at the health department or your family doctor  Everyone who will be around your baby should also be up-to-date on their vaccines. Adults (who are not pregnant) only need 1 dose of Tdap during adulthood.   [place] [facility]ians:  [facility] [phone]            [facility] [phone]                 [facility] [phone] (usually doesn't accept new patients unless you have family there already, you are always welcome to call and ask)             [facility] ([address] [place]) [phone]   [place] [facility]ians:   [facility]: [phone]  [facility]: [phone]    Third Trimester of Pregnancy The third trimester is from week 29 through week 42, months 7 through 9. The third trimester is a time when the fetus is  growing rapidly. At the end of the ninth month, the fetus is about 20 inches in length and weighs 6-10 pounds.  BODY CHANGES Your body goes through many changes during pregnancy. The changes vary from woman to woman.  9. Your weight will continue to increase. You can expect to gain 25-35 pounds (11-16 kg) by the end of the pregnancy. 10. You may begin to get stretch marks on your hips, abdomen, and breasts. 11. You may urinate more often because the fetus is moving lower into your pelvis and pressing on your bladder. 12. You may develop or continue to have heartburn as a result of your pregnancy. 13. You may develop constipation because certain hormones are causing the muscles that push waste through your intestines to slow down. 14. You may develop hemorrhoids or swollen, bulging veins (varicose veins). 15. You may have pelvic pain because of the weight gain and pregnancy hormones relaxing your joints between the bones in your pelvis. Backaches may result from overexertion of the muscles supporting your posture. 16. You may have changes in your hair. These can include thickening of your hair, rapid growth, and changes in texture. Some women also have hair loss during or after pregnancy, or hair that feels dry or thin. Your hair will most likely return to   normal after your baby is born. 17. Your breasts will continue to grow and be tender. A yellow discharge may leak from your breasts called colostrum. 18. Your belly button may stick out. 19. You may feel short of breath because of your expanding uterus. 20. You may notice the fetus "dropping," or moving lower in your abdomen. 21. You may have a bloody mucus discharge. This usually occurs a few days to a week before labor begins. 22. Your cervix becomes thin and soft (effaced) near your due date. WHAT TO EXPECT AT YOUR PRENATAL EXAMS  You will have prenatal exams every 2 weeks until week 36. Then, you will have weekly prenatal exams. During a  routine prenatal visit: 3. You will be weighed to make sure you and the fetus are growing normally. 4. Your blood pressure is taken. 5. Your abdomen will be measured to track your baby's growth. 6. The fetal heartbeat will be listened to. 7. Any test results from the previous visit will be discussed. 8. You may have a cervical check near your due date to see if you have effaced. At around 36 weeks, your caregiver will check your cervix. At the same time, your caregiver will also perform a test on the secretions of the vaginal tissue. This test is to determine if a type of bacteria, Group B streptococcus, is present. Your caregiver will explain this further. Your caregiver may ask you: 2. What your birth plan is. 3. How you are feeling. 4. If you are feeling the baby move. 5. If you have had any abnormal symptoms, such as leaking fluid, bleeding, severe headaches, or abdominal cramping. 6. If you have any questions. Other tests or screenings that may be performed during your third trimester include: 2. Blood tests that check for low iron levels (anemia). 3. Fetal testing to check the health, activity level, and growth of the fetus. Testing is done if you have certain medical conditions or if there are problems during the pregnancy. FALSE LABOR You may feel small, irregular contractions that eventually go away. These are called Braxton Hicks contractions, or false labor. Contractions may last for hours, days, or even weeks before true labor sets in. If contractions come at regular intervals, intensify, or become painful, it is best to be seen by your caregiver.  SIGNS OF LABOR  3. Menstrual-like cramps. 4. Contractions that are 5 minutes apart or less. 5. Contractions that start on the top of the uterus and spread down to the lower abdomen and back. 6. A sense of increased pelvic pressure or back pain. 7. A watery or bloody mucus discharge that comes from the vagina. If you have any of these  signs before the 37th week of pregnancy, call your caregiver right away. You need to go to the hospital to get checked immediately. HOME CARE INSTRUCTIONS   Avoid all smoking, herbs, alcohol, and unprescribed drugs. These chemicals affect the formation and growth of the baby.  Follow your caregiver's instructions regarding medicine use. There are medicines that are either safe or unsafe to take during pregnancy.  Exercise only as directed by your caregiver. Experiencing uterine cramps is a good sign to stop exercising.  Continue to eat regular, healthy meals.  Wear a good support bra for breast tenderness.  Do not use hot tubs, steam rooms, or saunas.  Wear your seat belt at all times when driving.  Avoid raw meat, uncooked cheese, cat litter boxes, and soil used by cats. These carry germs that can   cause birth defects in the baby.  Take your prenatal vitamins.  Try taking a stool softener (if your caregiver approves) if you develop constipation. Eat more high-fiber foods, such as fresh vegetables or fruit and whole grains. Drink plenty of fluids to keep your urine clear or pale yellow.  Take warm sitz baths to soothe any pain or discomfort caused by hemorrhoids. Use hemorrhoid cream if your caregiver approves.  If you develop varicose veins, wear support hose. Elevate your feet for 15 minutes, 3-4 times a day. Limit salt in your diet.  Avoid heavy lifting, wear low heal shoes, and practice good posture.  Rest a lot with your legs elevated if you have leg cramps or low back pain.  Visit your dentist if you have not gone during your pregnancy. Use a soft toothbrush to brush your teeth and be gentle when you floss.  A sexual relationship may be continued unless your caregiver directs you otherwise.  Do not travel far distances unless it is absolutely necessary and only with the approval of your caregiver.  Take prenatal classes to understand, practice, and ask questions about the  labor and delivery.  Make a trial run to the hospital.  Pack your hospital bag.  Prepare the baby's nursery.  Continue to go to all your prenatal visits as directed by your caregiver. SEEK MEDICAL CARE IF:  You are unsure if you are in labor or if your water has broken.  You have dizziness.  You have mild pelvic cramps, pelvic pressure, or nagging pain in your abdominal area.  You have persistent nausea, vomiting, or diarrhea.  You have a bad smelling vaginal discharge.  You have pain with urination. SEEK IMMEDIATE MEDICAL CARE IF:   You have a fever.  You are leaking fluid from your vagina.  You have spotting or bleeding from your vagina.  You have severe abdominal cramping or pain.  You have rapid weight loss or gain.  You have shortness of breath with chest pain.  You notice sudden or extreme swelling of your face, hands, ankles, feet, or legs.  You have not felt your baby move in over an hour.  You have severe headaches that do not go away with medicine.  You have vision changes. Document Released: 12/04/2001 Document Revised: 12/15/2013 Document Reviewed: 02/10/2013 ExitCare Patient Information 2015 [employer]. This information is not intended to replace advice given to you by your health care provider. Make sure you discuss any questions you have with your health care provider.   

## 2014-11-23 LAB — DRUG SCREEN, URINE, NO CONFIRMATION
Amphetamine Screen, Ur: NEGATIVE
BARBITURATE QUANT UR: NEGATIVE
BENZODIAZEPINES.: NEGATIVE
COCAINE METABOLITES: NEGATIVE
Creatinine,U: 48.1 mg/dL
METHADONE: NEGATIVE
Marijuana Metabolite: NEGATIVE
Opiate Screen, Urine: NEGATIVE
Phencyclidine (PCP): NEGATIVE
Propoxyphene: NEGATIVE

## 2014-11-23 LAB — ANTIBODY SCREEN: Antibody Screen: NEGATIVE

## 2014-11-23 LAB — RPR

## 2014-11-23 LAB — HSV 2 ANTIBODY, IGG: HSV 2 Glycoprotein G Ab, IgG: <0.1 IV

## 2014-11-23 LAB — GLUCOSE TOLERANCE, 2 HOURS W/ 1HR
Glucose, 1 hour: 127 mg/dL (ref 70–170)
Glucose, 2 hour: 107 mg/dL (ref 70–139)
Glucose, Fasting: 72 mg/dL (ref 70–99)

## 2014-11-23 LAB — HIV ANTIBODY (ROUTINE TESTING W REFLEX): HIV 1&2 Ab, 4th Generation: NONREACTIVE

## 2014-12-20 ENCOUNTER — Encounter: Payer: Self-pay | Admitting: Women's Health

## 2014-12-20 ENCOUNTER — Ambulatory Visit (INDEPENDENT_AMBULATORY_CARE_PROVIDER_SITE_OTHER): Payer: 59 | Admitting: Women's Health

## 2014-12-20 VITALS — BP 112/60 | Wt 177.5 lb

## 2014-12-20 DIAGNOSIS — Z1389 Encounter for screening for other disorder: Secondary | ICD-10-CM

## 2014-12-20 DIAGNOSIS — Z3493 Encounter for supervision of normal pregnancy, unspecified, third trimester: Secondary | ICD-10-CM

## 2014-12-20 DIAGNOSIS — O360131 Maternal care for anti-D [Rh] antibodies, third trimester, fetus 1: Secondary | ICD-10-CM

## 2014-12-20 DIAGNOSIS — O360991 Maternal care for other rhesus isoimmunization, unspecified trimester, fetus 1: Secondary | ICD-10-CM

## 2014-12-20 DIAGNOSIS — R82998 Other abnormal findings in urine: Secondary | ICD-10-CM

## 2014-12-20 DIAGNOSIS — Z331 Pregnant state, incidental: Secondary | ICD-10-CM

## 2014-12-20 DIAGNOSIS — Z3483 Encounter for supervision of other normal pregnancy, third trimester: Secondary | ICD-10-CM

## 2014-12-20 LAB — POCT URINALYSIS DIPSTICK
Blood, UA: NEGATIVE
Glucose, UA: NEGATIVE
Ketones, UA: NEGATIVE
Nitrite, UA: NEGATIVE

## 2014-12-20 MED ORDER — RHO D IMMUNE GLOBULIN 1500 UNIT/2ML IJ SOSY
300.0000 ug | PREFILLED_SYRINGE | Freq: Once | INTRAMUSCULAR | Status: AC
  Administered 2014-12-20: 300 ug via INTRAMUSCULAR

## 2014-12-20 NOTE — Patient Instructions (Signed)
Call the office [phone]) or [facility]:  You begin to have strong, frequent contractions  Your water breaks.  Sometimes it is a big gush of fluid, sometimes it is just a trickle that keeps getting your panties wet or running down your legs  You have vaginal bleeding.  It is normal to have a small amount of spotting if your cervix was checked.   You don't feel your baby moving like normal.  If you don't, get you something to eat and drink and lay down and focus on feeling your baby move.  You should feel at least 10 movements in 2 hours.  If you don't, you should call the office or [facility].    Rh0 [D] Immune Globulin injection What is this medicine? RhO [D] IMMUNE GLOBULIN (i MYOON GLOB yoo lin) is used to treat idiopathic thrombocytopenic purpura (ITP). This medicine is used in RhO negative mothers who are pregnant with a RhO positive child. It is also used after a transfusion of RhO positive blood into a RhO negative person. This medicine may be used for other purposes; ask your health care provider or pharmacist if you have questions. COMMON BRAND NAME(S): BayRho-D, HyperRHO S/D, MICRhoGAM, RhoGAM, Rhophylac, WinRho SDF What should I tell my health care provider before I take this medicine? They need to know if you have any of these conditions: -bleeding disorders -low levels of immunoglobulin A in the body -no spleen -an unusual or allergic reaction to human immune globulin, other medicines, foods, dyes, or preservatives -pregnant or trying to get pregnant -breast-feeding How should I use this medicine? This medicine is for injection into a muscle or into a vein. It is given by a health care professional in a hospital or clinic setting. Talk to your pediatrician regarding the use of this medicine in children. This medicine is not approved for use in children. Overdosage: If you think you have taken too much of this medicine contact a poison control center  or emergency room at once. NOTE: This medicine is only for you. Do not share this medicine with others. What if I miss a dose? It is important not to miss your dose. Call your doctor or health care professional if you are unable to keep an appointment. What may interact with this medicine? -live virus vaccines, like measles, mumps, or rubella This list may not describe all possible interactions. Give your health care provider a list of all the medicines, herbs, non-prescription drugs, or dietary supplements you use. Also tell them if you smoke, drink alcohol, or use illegal drugs. Some items may interact with your medicine. What should I watch for while using this medicine? This medicine is made from human blood. It may be possible to pass an infection in this medicine. Talk to your doctor about the risks and benefits of this medicine. This medicine may interfere with live virus vaccines. Before you get live virus vaccines tell your health care professional if you have received this medicine within the past 3 months. What side effects may I notice from receiving this medicine? Side effects that you should report to your doctor or health care professional as soon as possible: -allergic reactions like skin rash, itching or hives, swelling of the face, lips, or tongue -breathing problems -chest pain or tightness -yellowing of the eyes or skin Side effects that usually do not require medical attention (report to your doctor or health care professional if they continue or are bothersome): -fever -  pain and tenderness at site where injected This list may not describe all possible side effects. Call your doctor for medical advice about side effects. You may report side effects to FDA at 1-800-FDA-1088. Where should I keep my medicine? This drug is given in a hospital or clinic and will not be stored at home. NOTE: This sheet is a summary. It may not cover all possible information. If you have questions  about this medicine, talk to your doctor, pharmacist, or health care provider.  2015, Elsevier/Gold Standard. (2008-08-09 14:06:10)

## 2014-12-20 NOTE — Progress Notes (Signed)
Low-risk OB appointment G2P0010 7350w3d Estimated Date of Delivery: 02/11/15 BP 112/60 mmHg  Wt 177 lb 8 oz (80.513 kg)  LMP 05/02/2014  BP, weight, and urine reviewed.  Refer to obstetrical flow sheet for FH & FHR.  Reports good fm.  Denies regular uc's, lof, vb, or uti s/s. No complaints. Reviewed ptl s/s, fkc. Plan:  Continue routine obstetrical care  F/U in 2wks for OB appointment  Rhogam today

## 2014-12-20 NOTE — Addendum Note (Signed)
Addended by: Criss AlvinePULLIAM, Henrry Feil G on: 12/20/2014 09:47 AM   Modules accepted: Orders

## 2014-12-22 LAB — URINE CULTURE
COLONY COUNT: NO GROWTH
ORGANISM ID, BACTERIA: NO GROWTH

## 2014-12-24 NOTE — L&D Delivery Note (Signed)
Patient is 24 y.o. G2P0010 5971w6d admitted 02/10/15.   Delivery Note At 3:33 PM a healthy female was delivered via Vaginal, Spontaneous Delivery (Presentation: Right Occiput Anterior).  APGAR: 8, 9; weight pending Placenta status: Intact, Spontaneous.  Cord: 3 vessels with the following complications: None.  Anesthesia: Epidural  Episiotomy: None Lacerations: Superficial bilat labia, hemostatic.  Suture Repair:N/A  Mom to postpartum.  Baby to Couplet care / Skin to Skin. Placenta to: BS Feeding: Breast Circ: OP Contraception: undecided  Araceli BoucheRumley, [place] N 02/10/2015, 4:56 PM  I was present for the entire delivery of baby and placenta and inspection of perineum and agree with the above note.  [place] Rashidah [place], [place] 02/10/2015 6:46 PM

## 2014-12-27 ENCOUNTER — Telehealth: Payer: Self-pay | Admitting: Women's Health

## 2014-12-28 NOTE — Telephone Encounter (Signed)
Pt informed urine culture from 12/20/2014 was negative.

## 2015-01-03 ENCOUNTER — Encounter: Payer: Self-pay | Admitting: Obstetrics & Gynecology

## 2015-01-03 ENCOUNTER — Ambulatory Visit (INDEPENDENT_AMBULATORY_CARE_PROVIDER_SITE_OTHER): Payer: 59 | Admitting: Obstetrics & Gynecology

## 2015-01-03 VITALS — BP 110/60 | Wt 183.0 lb

## 2015-01-03 DIAGNOSIS — Z331 Pregnant state, incidental: Secondary | ICD-10-CM

## 2015-01-03 DIAGNOSIS — Z3493 Encounter for supervision of normal pregnancy, unspecified, third trimester: Secondary | ICD-10-CM

## 2015-01-03 DIAGNOSIS — Z1389 Encounter for screening for other disorder: Secondary | ICD-10-CM

## 2015-01-03 LAB — POCT URINALYSIS DIPSTICK
Glucose, UA: NEGATIVE
Ketones, UA: NEGATIVE
Nitrite, UA: NEGATIVE
Protein, UA: NEGATIVE

## 2015-01-03 NOTE — Progress Notes (Signed)
G2P0010 3529w3d Estimated Date of Delivery: 02/11/15  Blood pressure 110/60, weight 183 lb (83.008 kg), last menstrual period 05/02/2014.   BP weight and urine results all reviewed and noted.  Please refer to the obstetrical flow sheet for the fundal height and fetal heart rate documentation:  Patient reports good fetal movement, denies any bleeding and no rupture of membranes symptoms or regular contractions. Patient is without complaints. All questions were answered.  Plan:  Continued routine obstetrical care,   Follow up in 2 weeks for OB appointment, routine

## 2015-01-17 ENCOUNTER — Ambulatory Visit (INDEPENDENT_AMBULATORY_CARE_PROVIDER_SITE_OTHER): Payer: 59 | Admitting: Women's Health

## 2015-01-17 ENCOUNTER — Encounter: Payer: Self-pay | Admitting: Women's Health

## 2015-01-17 VITALS — BP 124/70 | Wt 186.0 lb

## 2015-01-17 DIAGNOSIS — Z1389 Encounter for screening for other disorder: Secondary | ICD-10-CM

## 2015-01-17 DIAGNOSIS — Z3483 Encounter for supervision of other normal pregnancy, third trimester: Secondary | ICD-10-CM

## 2015-01-17 DIAGNOSIS — Z331 Pregnant state, incidental: Secondary | ICD-10-CM

## 2015-01-17 LAB — POCT URINALYSIS DIPSTICK
Blood, UA: NEGATIVE
Glucose, UA: NEGATIVE
KETONES UA: NEGATIVE
LEUKOCYTES UA: NEGATIVE
Nitrite, UA: NEGATIVE
Protein, UA: NEGATIVE

## 2015-01-17 NOTE — Patient Instructions (Signed)
Call the office ([phone]) or go to [facility] if:  You begin to have strong, frequent contractions  Your water breaks.  Sometimes it is a big gush of fluid, sometimes it is just a trickle that keeps getting your panties wet or running down your legs  You have vaginal bleeding.  It is normal to have a small amount of spotting if your cervix was checked.   You don't feel your baby moving like normal.  If you don't, get you something to eat and drink and lay down and focus on feeling your baby move.  You should feel at least 10 movements in 2 hours.  If you don't, you should call the office or go to [facility].    Preterm Labor Information Preterm labor is when labor starts at less than 37 weeks of pregnancy. The normal length of a pregnancy is 39 to 41 weeks. CAUSES Often, there is no identifiable underlying cause as to why a woman goes into preterm labor. One of the most common known causes of preterm labor is infection. Infections of the uterus, cervix, vagina, amniotic sac, bladder, kidney, or even the lungs (pneumonia) can cause labor to start. Other suspected causes of preterm labor include:   Urogenital infections, such as yeast infections and bacterial vaginosis.   Uterine abnormalities (uterine shape, uterine septum, fibroids, or bleeding from the placenta).   A cervix that has been operated on (it may fail to stay closed).   Malformations in the fetus.   Multiple gestations (twins, triplets, and so on).   Breakage of the amniotic sac.  RISK FACTORS  Having a previous history of preterm labor.   Having premature rupture of membranes (PROM).   Having a placenta that covers the opening of the cervix (placenta previa).   Having a placenta that separates from the uterus (placental abruption).   Having a cervix that is too weak to hold the fetus in the uterus (incompetent cervix).   Having too much fluid in the amniotic sac (polyhydramnios).   Taking  illegal drugs or smoking while pregnant.   Not gaining enough weight while pregnant.   Being younger than 18 and older than 23 years old.   Having a low socioeconomic status.   Being African American. SYMPTOMS Signs and symptoms of preterm labor include:   Menstrual-like cramps, abdominal pain, or back pain.  Uterine contractions that are regular, as frequent as six in an hour, regardless of their intensity (may be mild or painful).  Contractions that start on the top of the uterus and spread down to the lower abdomen and back.   A sense of increased pelvic pressure.   A watery or bloody mucus discharge that comes from the vagina.  TREATMENT Depending on the length of the pregnancy and other circumstances, your health care provider may suggest bed rest. If necessary, there are medicines that can be given to stop contractions and to mature the fetal lungs. If labor happens before 34 weeks of pregnancy, a prolonged hospital stay may be recommended. Treatment depends on the condition of both you and the fetus.  WHAT SHOULD YOU DO IF YOU THINK YOU ARE IN PRETERM LABOR? Call your health care provider right away. You will need to go to the hospital to get checked immediately. HOW CAN YOU PREVENT PRETERM LABOR IN FUTURE PREGNANCIES? You should:   Stop smoking if you smoke.  Maintain healthy weight gain and avoid chemicals and drugs that are not necessary.  Be watchful for   any type of infection.  Inform your health care provider if you have a known history of preterm labor. Document Released: 03/01/2004 Document Revised: 08/12/2013 Document Reviewed: 01/12/2013 ExitCare Patient Information 2015 [employer]. This information is not intended to replace advice given to you by your health care provider. Make sure you discuss any questions you have with your health care provider.  

## 2015-01-17 NOTE — Progress Notes (Signed)
Low-risk OB appointment G2P0010 3953w3d Estimated Date of Delivery: 02/11/15 LMP 05/02/2014  BP, weight, and urine reviewed.  Refer to obstetrical flow sheet for FH & FHR.  Reports good fm.  Denies regular uc's, lof, vb, or uti s/s. No complaints. Didn't make it to classes, recommended tour.  Reviewed ptl s/s, fkc. Plan:  Continue routine obstetrical care  F/U in 1wk for OB appointment

## 2015-01-24 ENCOUNTER — Ambulatory Visit (INDEPENDENT_AMBULATORY_CARE_PROVIDER_SITE_OTHER): Payer: 59 | Admitting: Women's Health

## 2015-01-24 ENCOUNTER — Encounter: Payer: Self-pay | Admitting: Women's Health

## 2015-01-24 VITALS — BP 118/80 | Wt 190.0 lb

## 2015-01-24 DIAGNOSIS — Z3483 Encounter for supervision of other normal pregnancy, third trimester: Secondary | ICD-10-CM

## 2015-01-24 DIAGNOSIS — Z3685 Encounter for antenatal screening for Streptococcus B: Secondary | ICD-10-CM

## 2015-01-24 DIAGNOSIS — Z118 Encounter for screening for other infectious and parasitic diseases: Secondary | ICD-10-CM

## 2015-01-24 DIAGNOSIS — Z331 Pregnant state, incidental: Secondary | ICD-10-CM

## 2015-01-24 DIAGNOSIS — Z1159 Encounter for screening for other viral diseases: Secondary | ICD-10-CM

## 2015-01-24 DIAGNOSIS — Z1389 Encounter for screening for other disorder: Secondary | ICD-10-CM

## 2015-01-24 LAB — POCT URINALYSIS DIPSTICK
Blood, UA: NEGATIVE
Glucose, UA: NEGATIVE
Ketones, UA: NEGATIVE
Nitrite, UA: NEGATIVE
Protein, UA: NEGATIVE

## 2015-01-24 LAB — OB RESULTS CONSOLE GBS: GBS: NEGATIVE

## 2015-01-24 LAB — OB RESULTS CONSOLE GC/CHLAMYDIA
Chlamydia: NEGATIVE
Gonorrhea: NEGATIVE

## 2015-01-24 NOTE — Progress Notes (Signed)
Low-risk OB appointment G2P0010 8132w3d Estimated Date of Delivery: 02/11/15 BP 118/80 mmHg  Wt 190 lb (86.183 kg)  LMP 05/02/2014  BP, weight, and urine reviewed.  Refer to obstetrical flow sheet for FH & FHR.  Reports good fm.  Denies regular uc's, lof, vb, or uti s/s. No complaints. GBS collected SVE per request: 1.5/50/-2, vtx Reviewed labor s/s, fkc. Plan:  Continue routine obstetrical care  F/U in 1wk for OB appointment

## 2015-01-24 NOTE — Patient Instructions (Signed)
Call the office ([phone]) or go to [facility] if:  You begin to have strong, frequent contractions  Your water breaks.  Sometimes it is a big gush of fluid, sometimes it is just a trickle that keeps getting your panties wet or running down your legs  You have vaginal bleeding.  It is normal to have a small amount of spotting if your cervix was checked.   You don't feel your baby moving like normal.  If you don't, get you something to eat and drink and lay down and focus on feeling your baby move.  You should feel at least 10 movements in 2 hours.  If you don't, you should call the office or go to [facility].    Braxton Hicks Contractions Contractions of the uterus can occur throughout pregnancy. Contractions are not always a sign that you are in labor.  WHAT ARE BRAXTON HICKS CONTRACTIONS?  Contractions that occur before labor are called Braxton Hicks contractions, or false labor. Toward the end of pregnancy (32-34 weeks), these contractions can develop more often and may become more forceful. This is not true labor because these contractions do not result in opening (dilatation) and thinning of the cervix. They are sometimes difficult to tell apart from true labor because these contractions can be forceful and people have different pain tolerances. You should not feel embarrassed if you go to the hospital with false labor. Sometimes, the only way to tell if you are in true labor is for your health care provider to look for changes in the cervix. If there are no prenatal problems or other health problems associated with the pregnancy, it is completely safe to be sent home with false labor and await the onset of true labor. HOW CAN YOU TELL THE DIFFERENCE BETWEEN TRUE AND FALSE LABOR? False Labor  The contractions of false labor are usually shorter and not as hard as those of true labor.   The contractions are usually irregular.   The contractions are often felt in the front of  the lower abdomen and in the groin.   The contractions may go away when you walk around or change positions while lying down.   The contractions get weaker and are shorter lasting as time goes on.   The contractions do not usually become progressively stronger, regular, and closer together as with true labor.  True Labor  Contractions in true labor last 30-70 seconds, become very regular, usually become more intense, and increase in frequency.   The contractions do not go away with walking.   The discomfort is usually felt in the top of the uterus and spreads to the lower abdomen and low back.   True labor can be determined by your health care provider with an exam. This will show that the cervix is dilating and getting thinner.  WHAT TO REMEMBER  Keep up with your usual exercises and follow other instructions given by your health care provider.   Take medicines as directed by your health care provider.   Keep your regular prenatal appointments.   Eat and drink lightly if you think you are going into labor.   If Braxton Hicks contractions are making you uncomfortable:   Change your position from lying down or resting to walking, or from walking to resting.   Sit and rest in a tub of warm water.   Drink 2-3 glasses of water. Dehydration may cause these contractions.   Do slow and deep breathing several times an hour.    WHEN SHOULD I SEEK IMMEDIATE MEDICAL CARE? Seek immediate medical care if:  Your contractions become stronger, more regular, and closer together.   You have fluid leaking or gushing from your vagina.   You have a fever.   You pass blood-tinged mucus.   You have vaginal bleeding.   You have continuous abdominal pain.   You have low back pain that you never had before.   You feel your baby's head pushing down and causing pelvic pressure.   Your baby is not moving as much as it used to.  Document Released: 12/10/2005 Document  Revised: 12/15/2013 Document Reviewed: 09/21/2013 ExitCare Patient Information 2015 [employer]. This information is not intended to replace advice given to you by your health care provider. Make sure you discuss any questions you have with your health care provider.  

## 2015-01-25 ENCOUNTER — Ambulatory Visit (INDEPENDENT_AMBULATORY_CARE_PROVIDER_SITE_OTHER): Payer: 59 | Admitting: Obstetrics & Gynecology

## 2015-01-25 ENCOUNTER — Encounter: Payer: Self-pay | Admitting: Obstetrics & Gynecology

## 2015-01-25 VITALS — BP 118/70 | Wt 189.0 lb

## 2015-01-25 DIAGNOSIS — Z3483 Encounter for supervision of other normal pregnancy, third trimester: Secondary | ICD-10-CM

## 2015-01-25 DIAGNOSIS — Z331 Pregnant state, incidental: Secondary | ICD-10-CM

## 2015-01-25 DIAGNOSIS — Z1389 Encounter for screening for other disorder: Secondary | ICD-10-CM

## 2015-01-25 LAB — POCT URINALYSIS DIPSTICK
Blood, UA: NEGATIVE
Glucose, UA: NEGATIVE
Ketones, UA: NEGATIVE
Nitrite, UA: NEGATIVE
Protein, UA: NEGATIVE

## 2015-01-25 NOTE — Progress Notes (Signed)
Pt with left lateral abdominal pain The area is on the lateral uterine wall, not tight to palpation and the pain is pretty constant Baby is also moving significantly in that particular area When pt is complaining of the pain the uterus is soft  G2P0010 2964w4d Estimated Date of Delivery: 02/11/15  Blood pressure 118/70, weight 189 lb (85.73 kg), last menstrual period 05/02/2014.   BP weight and urine results all reviewed and noted.  Please refer to the obstetrical flow sheet for the fundal height and fetal heart rate documentation:  Patient reports good fetal movement, denies any bleeding and no rupture of membranes symptoms or regular contractions. Patient is without complaints. All questions were answered.  Plan:  Continued routine obstetrical care,   Follow up in keep  weeks for OB appointment, routine

## 2015-01-26 LAB — GC/CHLAMYDIA PROBE AMP
Chlamydia trachomatis, NAA: NEGATIVE
NEISSERIA GONORRHOEAE BY PCR: NEGATIVE

## 2015-01-28 LAB — CULTURE, BETA STREP (GROUP B ONLY): STREP GP B CULTURE: NEGATIVE

## 2015-01-31 ENCOUNTER — Ambulatory Visit (INDEPENDENT_AMBULATORY_CARE_PROVIDER_SITE_OTHER): Payer: 59 | Admitting: Women's Health

## 2015-01-31 VITALS — BP 136/84 | Wt 187.0 lb

## 2015-01-31 DIAGNOSIS — Z331 Pregnant state, incidental: Secondary | ICD-10-CM

## 2015-01-31 DIAGNOSIS — Z1389 Encounter for screening for other disorder: Secondary | ICD-10-CM

## 2015-01-31 DIAGNOSIS — Z3483 Encounter for supervision of other normal pregnancy, third trimester: Secondary | ICD-10-CM

## 2015-01-31 LAB — POCT URINALYSIS DIPSTICK
Glucose, UA: NEGATIVE
KETONES UA: NEGATIVE
LEUKOCYTES UA: NEGATIVE
NITRITE UA: NEGATIVE
PROTEIN UA: NEGATIVE
RBC UA: NEGATIVE

## 2015-01-31 NOTE — Patient Instructions (Signed)
Call the office ([phone]) or go to [facility] if:  You begin to have strong, frequent contractions  Your water breaks.  Sometimes it is a big gush of fluid, sometimes it is just a trickle that keeps getting your panties wet or running down your legs  You have vaginal bleeding.  It is normal to have a small amount of spotting if your cervix was checked.   You don't feel your baby moving like normal.  If you don't, get you something to eat and drink and lay down and focus on feeling your baby move.  You should feel at least 10 movements in 2 hours.  If you don't, you should call the office or go to [facility].    Braxton Hicks Contractions Contractions of the uterus can occur throughout pregnancy. Contractions are not always a sign that you are in labor.  WHAT ARE BRAXTON HICKS CONTRACTIONS?  Contractions that occur before labor are called Braxton Hicks contractions, or false labor. Toward the end of pregnancy (32-34 weeks), these contractions can develop more often and may become more forceful. This is not true labor because these contractions do not result in opening (dilatation) and thinning of the cervix. They are sometimes difficult to tell apart from true labor because these contractions can be forceful and people have different pain tolerances. You should not feel embarrassed if you go to the hospital with false labor. Sometimes, the only way to tell if you are in true labor is for your health care provider to look for changes in the cervix. If there are no prenatal problems or other health problems associated with the pregnancy, it is completely safe to be sent home with false labor and await the onset of true labor. HOW CAN YOU TELL THE DIFFERENCE BETWEEN TRUE AND FALSE LABOR? False Labor  The contractions of false labor are usually shorter and not as hard as those of true labor.   The contractions are usually irregular.   The contractions are often felt in the front of  the lower abdomen and in the groin.   The contractions may go away when you walk around or change positions while lying down.   The contractions get weaker and are shorter lasting as time goes on.   The contractions do not usually become progressively stronger, regular, and closer together as with true labor.  True Labor  Contractions in true labor last 30-70 seconds, become very regular, usually become more intense, and increase in frequency.   The contractions do not go away with walking.   The discomfort is usually felt in the top of the uterus and spreads to the lower abdomen and low back.   True labor can be determined by your health care provider with an exam. This will show that the cervix is dilating and getting thinner.  WHAT TO REMEMBER  Keep up with your usual exercises and follow other instructions given by your health care provider.   Take medicines as directed by your health care provider.   Keep your regular prenatal appointments.   Eat and drink lightly if you think you are going into labor.   If Braxton Hicks contractions are making you uncomfortable:   Change your position from lying down or resting to walking, or from walking to resting.   Sit and rest in a tub of warm water.   Drink 2-3 glasses of water. Dehydration may cause these contractions.   Do slow and deep breathing several times an hour.    WHEN SHOULD I SEEK IMMEDIATE MEDICAL CARE? Seek immediate medical care if:  Your contractions become stronger, more regular, and closer together.   You have fluid leaking or gushing from your vagina.   You have a fever.   You pass blood-tinged mucus.   You have vaginal bleeding.   You have continuous abdominal pain.   You have low back pain that you never had before.   You feel your baby's head pushing down and causing pelvic pressure.   Your baby is not moving as much as it used to.  Document Released: 12/10/2005 Document  Revised: 12/15/2013 Document Reviewed: 09/21/2013 ExitCare Patient Information 2015 [employer]. This information is not intended to replace advice given to you by your health care provider. Make sure you discuss any questions you have with your health care provider.  

## 2015-01-31 NOTE — Progress Notes (Signed)
Low-risk OB appointment G2P0010 5972w3d Estimated Date of Delivery: 02/11/15 BP 136/84 mmHg  Wt 187 lb (84.823 kg)  LMP 05/02/2014  BP, weight, and urine reviewed.  Refer to obstetrical flow sheet for FH & FHR.  Reports good fm.  Denies regular uc's, lof, vb, or uti s/s. No complaints. Denies ha, scotomata, ruq/epigastric pain, n/v.   DTRs 2+, no clonus, trace edema SVE per request: 1.5/50/-2, vtx Reviewed labor s/s, fkc. Plan:  Continue routine obstetrical care  F/U in 1wk for OB appointment, interested in membrane sweeping at that time

## 2015-02-07 ENCOUNTER — Encounter: Payer: 59 | Admitting: Women's Health

## 2015-02-08 ENCOUNTER — Ambulatory Visit (INDEPENDENT_AMBULATORY_CARE_PROVIDER_SITE_OTHER): Payer: 59 | Admitting: Obstetrics and Gynecology

## 2015-02-08 ENCOUNTER — Encounter: Payer: Self-pay | Admitting: Obstetrics and Gynecology

## 2015-02-08 VITALS — BP 120/80 | Wt 190.0 lb

## 2015-02-08 DIAGNOSIS — Z3483 Encounter for supervision of other normal pregnancy, third trimester: Secondary | ICD-10-CM | POA: Diagnosis not present

## 2015-02-08 DIAGNOSIS — Z3493 Encounter for supervision of normal pregnancy, unspecified, third trimester: Secondary | ICD-10-CM

## 2015-02-08 DIAGNOSIS — Z1389 Encounter for screening for other disorder: Secondary | ICD-10-CM

## 2015-02-08 DIAGNOSIS — Z331 Pregnant state, incidental: Secondary | ICD-10-CM

## 2015-02-08 DIAGNOSIS — Z3490 Encounter for supervision of normal pregnancy, unspecified, unspecified trimester: Secondary | ICD-10-CM | POA: Insufficient documentation

## 2015-02-08 LAB — POCT URINALYSIS DIPSTICK
GLUCOSE UA: NEGATIVE
Ketones, UA: NEGATIVE
Leukocytes, UA: NEGATIVE
NITRITE UA: NEGATIVE
PROTEIN UA: NEGATIVE
RBC UA: NEGATIVE

## 2015-02-08 NOTE — Progress Notes (Signed)
Pt denies any problems or concerns at this time.  

## 2015-02-08 NOTE — Progress Notes (Signed)
G2P0010 5631w4d Estimated Date of Delivery: 02/11/15  Blood pressure 120/80, weight 190 lb (86.183 kg), last menstrual period 05/02/2014.   refer to the ob flow sheet for FH and FHR, also BP, Wt, Urine results:notable for neg   Patient reports   good fetal movement, denies any bleeding and no rupture of membranes symptoms or regular contractions. Patient complaints:none. MEMBRANES STRIPPED. .  Questions were answered. Assessment:  Plan:  Continued routine obstetrical care, reck 6 d \  F/u in 1 weeks for pnx,

## 2015-02-10 ENCOUNTER — Encounter (HOSPITAL_COMMUNITY): Payer: Self-pay | Admitting: Certified Registered Nurse Anesthetist

## 2015-02-10 ENCOUNTER — Inpatient Hospital Stay (HOSPITAL_COMMUNITY): Payer: 59 | Admitting: Anesthesiology

## 2015-02-10 ENCOUNTER — Encounter (HOSPITAL_COMMUNITY): Payer: Self-pay | Admitting: *Deleted

## 2015-02-10 ENCOUNTER — Inpatient Hospital Stay (HOSPITAL_COMMUNITY)
Admission: AD | Admit: 2015-02-10 | Discharge: 2015-02-12 | DRG: 775 | Disposition: A | Discharge status: Home / Self Care | Payer: 59 | Source: Ambulatory Visit | Attending: Obstetrics & Gynecology | Admitting: Obstetrics & Gynecology

## 2015-02-10 DIAGNOSIS — O4703 False labor before 37 completed weeks of gestation, third trimester: Secondary | ICD-10-CM | POA: Diagnosis present

## 2015-02-10 DIAGNOSIS — O360930 Maternal care for other rhesus isoimmunization, third trimester, not applicable or unspecified: Secondary | ICD-10-CM | POA: Diagnosis present

## 2015-02-10 DIAGNOSIS — Z833 Family history of diabetes mellitus: Secondary | ICD-10-CM | POA: Diagnosis not present

## 2015-02-10 DIAGNOSIS — Z3483 Encounter for supervision of other normal pregnancy, third trimester: Secondary | ICD-10-CM

## 2015-02-10 DIAGNOSIS — O468X9 Other antepartum hemorrhage, unspecified trimester: Secondary | ICD-10-CM

## 2015-02-10 DIAGNOSIS — IMO0001 Reserved for inherently not codable concepts without codable children: Secondary | ICD-10-CM

## 2015-02-10 DIAGNOSIS — O418X90 Other specified disorders of amniotic fluid and membranes, unspecified trimester, not applicable or unspecified: Secondary | ICD-10-CM

## 2015-02-10 DIAGNOSIS — Z3A39 39 weeks gestation of pregnancy: Secondary | ICD-10-CM | POA: Diagnosis present

## 2015-02-10 DIAGNOSIS — Z87891 Personal history of nicotine dependence: Secondary | ICD-10-CM

## 2015-02-10 LAB — CBC
HEMATOCRIT: 35.8 % — AB (ref 36.0–46.0)
Hemoglobin: 12.1 g/dL (ref 12.0–15.0)
MCH: 29.8 pg (ref 26.0–34.0)
MCHC: 33.8 g/dL (ref 30.0–36.0)
MCV: 88.2 fL (ref 78.0–100.0)
PLATELETS: 190 10*3/uL (ref 150–400)
RBC: 4.06 MIL/uL (ref 3.87–5.11)
RDW: 13.1 % (ref 11.5–15.5)
WBC: 14.9 10*3/uL — AB (ref 4.0–10.5)

## 2015-02-10 MED ORDER — IBUPROFEN 600 MG PO TABS
600.0000 mg | ORAL_TABLET | Freq: Four times a day (QID) | ORAL | Status: DC
  Administered 2015-02-10 – 2015-02-12 (×8): 600 mg via ORAL
  Filled 2015-02-10 (×8): qty 1

## 2015-02-10 MED ORDER — ONDANSETRON HCL 4 MG/2ML IJ SOLN: 4.0000 mg | Freq: Four times a day (QID) | INTRAMUSCULAR | Status: DC | PRN

## 2015-02-10 MED ORDER — OXYCODONE-ACETAMINOPHEN 5-325 MG PO TABS: 2.0000 | ORAL_TABLET | ORAL | Status: DC | PRN

## 2015-02-10 MED ORDER — ZOLPIDEM TARTRATE 5 MG PO TABS: 5.0000 mg | ORAL_TABLET | Freq: Every evening | ORAL | Status: DC | PRN

## 2015-02-10 MED ORDER — LIDOCAINE HCL (PF) 1 % IJ SOLN
30.0000 mL | INTRAMUSCULAR | Status: DC | PRN
  Filled 2015-02-10: qty 30

## 2015-02-10 MED ORDER — ACETAMINOPHEN 325 MG PO TABS: 650.0000 mg | ORAL_TABLET | ORAL | Status: DC | PRN

## 2015-02-10 MED ORDER — EPHEDRINE 5 MG/ML INJ
10.0000 mg | INTRAVENOUS | Status: DC | PRN
  Filled 2015-02-10: qty 2

## 2015-02-10 MED ORDER — ONDANSETRON HCL 4 MG/2ML IJ SOLN: 4.0000 mg | INTRAMUSCULAR | Status: DC | PRN

## 2015-02-10 MED ORDER — FLEET ENEMA 7-19 GM/118ML RE ENEM: 1.0000 | ENEMA | RECTAL | Status: DC | PRN

## 2015-02-10 MED ORDER — LANOLIN HYDROUS EX OINT: TOPICAL_OINTMENT | CUTANEOUS | Status: DC | PRN

## 2015-02-10 MED ORDER — FENTANYL 2.5 MCG/ML BUPIVACAINE 1/10 % EPIDURAL INFUSION (WH - ANES)
14.0000 mL/h | INTRAMUSCULAR | Status: DC | PRN
  Filled 2015-02-10: qty 125

## 2015-02-10 MED ORDER — LACTATED RINGERS IV SOLN: 500.0000 mL | Freq: Once | INTRAVENOUS | Status: DC

## 2015-02-10 MED ORDER — PROMETHAZINE HCL 25 MG/ML IJ SOLN: 25.0000 mg | Freq: Four times a day (QID) | INTRAMUSCULAR | Status: DC | PRN

## 2015-02-10 MED ORDER — OXYTOCIN 40 UNITS IN LACTATED RINGERS INFUSION - SIMPLE MED
1.0000 m[IU]/min | INTRAVENOUS | Status: DC
  Administered 2015-02-10: 2 m[IU]/min via INTRAVENOUS

## 2015-02-10 MED ORDER — BENZOCAINE-MENTHOL 20-0.5 % EX AERO
1.0000 "application " | INHALATION_SPRAY | CUTANEOUS | Status: DC | PRN
  Administered 2015-02-10: 1 via TOPICAL
  Filled 2015-02-10: qty 56

## 2015-02-10 MED ORDER — DIBUCAINE 1 % RE OINT: 1.0000 "application " | TOPICAL_OINTMENT | RECTAL | Status: DC | PRN

## 2015-02-10 MED ORDER — SIMETHICONE 80 MG PO CHEW: 80.0000 mg | CHEWABLE_TABLET | ORAL | Status: DC | PRN

## 2015-02-10 MED ORDER — OXYTOCIN BOLUS FROM INFUSION
500.0000 mL | INTRAVENOUS | Status: DC
  Administered 2015-02-10: 500 mL via INTRAVENOUS

## 2015-02-10 MED ORDER — FENTANYL 2.5 MCG/ML BUPIVACAINE 1/10 % EPIDURAL INFUSION (WH - ANES): 14.0000 mL/h | INTRAMUSCULAR | Status: DC | PRN

## 2015-02-10 MED ORDER — SENNOSIDES-DOCUSATE SODIUM 8.6-50 MG PO TABS
2.0000 | ORAL_TABLET | ORAL | Status: DC
  Administered 2015-02-10 – 2015-02-12 (×2): 2 via ORAL
  Filled 2015-02-10 (×2): qty 2

## 2015-02-10 MED ORDER — NALBUPHINE HCL 10 MG/ML IJ SOLN: 10.0000 mg | Freq: Once | INTRAMUSCULAR | Status: DC

## 2015-02-10 MED ORDER — CITRIC ACID-SODIUM CITRATE 334-500 MG/5ML PO SOLN: 30.0000 mL | ORAL | Status: DC | PRN

## 2015-02-10 MED ORDER — PHENYLEPHRINE 40 MCG/ML (10ML) SYRINGE FOR IV PUSH (FOR BLOOD PRESSURE SUPPORT)
80.0000 ug | PREFILLED_SYRINGE | INTRAVENOUS | Status: DC | PRN
  Filled 2015-02-10: qty 2

## 2015-02-10 MED ORDER — TERBUTALINE SULFATE 1 MG/ML IJ SOLN
0.2500 mg | Freq: Once | INTRAMUSCULAR | Status: DC | PRN
  Filled 2015-02-10: qty 1

## 2015-02-10 MED ORDER — WITCH HAZEL-GLYCERIN EX PADS: 1.0000 "application " | MEDICATED_PAD | CUTANEOUS | Status: DC | PRN

## 2015-02-10 MED ORDER — OXYCODONE-ACETAMINOPHEN 5-325 MG PO TABS: 1.0000 | ORAL_TABLET | ORAL | Status: DC | PRN

## 2015-02-10 MED ORDER — PRENATAL MULTIVITAMIN CH
1.0000 | ORAL_TABLET | Freq: Every day | ORAL | Status: DC
  Administered 2015-02-11 – 2015-02-12 (×2): 1 via ORAL
  Filled 2015-02-10 (×2): qty 1

## 2015-02-10 MED ORDER — TETANUS-DIPHTH-ACELL PERTUSSIS 5-2.5-18.5 LF-MCG/0.5 IM SUSP
0.5000 mL | Freq: Once | INTRAMUSCULAR | Status: AC
  Administered 2015-02-10: 0.5 mL via INTRAMUSCULAR
  Filled 2015-02-10: qty 0.5

## 2015-02-10 MED ORDER — ONDANSETRON HCL 4 MG PO TABS: 4.0000 mg | ORAL_TABLET | ORAL | Status: DC | PRN

## 2015-02-10 MED ORDER — FENTANYL 2.5 MCG/ML BUPIVACAINE 1/10 % EPIDURAL INFUSION (WH - ANES)
INTRAMUSCULAR | Status: DC | PRN
  Administered 2015-02-10: 14 mL/h via EPIDURAL

## 2015-02-10 MED ORDER — PHENYLEPHRINE 40 MCG/ML (10ML) SYRINGE FOR IV PUSH (FOR BLOOD PRESSURE SUPPORT)
80.0000 ug | PREFILLED_SYRINGE | INTRAVENOUS | Status: DC | PRN
  Filled 2015-02-10: qty 2
  Filled 2015-02-10: qty 20

## 2015-02-10 MED ORDER — LACTATED RINGERS IV SOLN: 500.0000 mL | INTRAVENOUS | Status: DC | PRN

## 2015-02-10 MED ORDER — DIPHENHYDRAMINE HCL 25 MG PO CAPS: 25.0000 mg | ORAL_CAPSULE | Freq: Four times a day (QID) | ORAL | Status: DC | PRN

## 2015-02-10 MED ORDER — OXYTOCIN 40 UNITS IN LACTATED RINGERS INFUSION - SIMPLE MED
62.5000 mL/h | INTRAVENOUS | Status: DC
  Filled 2015-02-10: qty 1000

## 2015-02-10 MED ORDER — LIDOCAINE HCL (PF) 1 % IJ SOLN
INTRAMUSCULAR | Status: DC | PRN
  Administered 2015-02-10 (×2): 4 mL

## 2015-02-10 MED ORDER — LACTATED RINGERS IV SOLN
INTRAVENOUS | Status: DC
  Administered 2015-02-10 (×2): via INTRAVENOUS

## 2015-02-10 MED ORDER — DIPHENHYDRAMINE HCL 50 MG/ML IJ SOLN: 12.5000 mg | INTRAMUSCULAR | Status: DC | PRN

## 2015-02-10 MED ORDER — LACTATED RINGERS IV SOLN: INTRAVENOUS | Status: DC

## 2015-02-10 NOTE — Anesthesia Procedure Notes (Signed)
Epidural Patient location during procedure: OB Start time: 02/10/2015 7:08 AM  Staffing Anesthesiologist: Branko Steeves A.  Preanesthetic Checklist Completed: patient identified, site marked, surgical consent, pre-op evaluation, timeout performed, IV checked, risks and benefits discussed and monitors and equipment checked  Epidural Patient position: sitting Prep: site prepped and draped and DuraPrep Patient monitoring: continuous pulse ox and blood pressure Approach: midline Location: L3-L4 Injection technique: LOR air  Needle:  Needle type: Tuohy  Needle gauge: 17 G Needle length: 9 cm and 9 Needle insertion depth: 5 cm cm Catheter type: closed end flexible Catheter size: 19 Gauge Catheter at skin depth: 10 cm Test dose: negative and Other  Assessment Events: blood not aspirated, injection not painful, no injection resistance, negative IV test and no paresthesia  Additional Notes Patient identified. Risks and benefits discussed including failed block, incomplete  Pain control, post dural puncture headache, nerve damage, paralysis, blood pressure Changes, nausea, vomiting, reactions to medications-both toxic and allergic and post Partum back pain. All questions were answered. Patient expressed understanding and wished to proceed. Sterile technique was used throughout procedure. Epidural site was Dressed with sterile barrier dressing. No paresthesias, signs of intravascular injection Or signs of intrathecal spread were encountered.  Patient was more comfortable after the epidural was dosed. Please see RN's note for documentation of vital signs and FHR which are stable.

## 2015-02-10 NOTE — Progress Notes (Signed)
S: Now noting regular contractions starting 10 minutes ago. No further complaints at this time.  O: Dilation 5cm, Effacement 90%, Station 1+, Vertex presentation  A/P: Active Labor - Will continue to monitor q2hr or sooner if indicated   Garry Heateraleigh Rumley, DO Family Medicine, PGY-1

## 2015-02-10 NOTE — Progress Notes (Signed)
Patient ID: Alejandra Moody, female   DOB: 1991-01-29, 24 y.o.   MRN: [id number] Alejandra Moody is a 24 y.o. G2P0010 at 6377w6d.  Subjective: Comfortable w/ epidural.  Objective: BP 105/80 mmHg  Pulse 102  Temp(Src) 98.2 F (36.8 C) (Oral)  Resp 18  Ht 5\' 3"  (1.6 m)  Wt 81.647 kg (180 lb)  BMI 31.89 kg/m2  SpO2 100%  LMP 05/02/2014   FHT:  FHR: 145 bpm, variability: mod,  accelerations:  15x15,  decelerations:  none UC:   Q 3-7 minutes, moderate  Dilation: 4.5 Effacement (%): 90 Station: -1 Presentation: Vertex Exam by:: Marijean HeathM. Robinson, RN  Labs: Results for orders placed or performed during the hospital encounter of 02/10/15 (from the past 24 hour(s))  CBC     Status: Abnormal   Collection Time: 02/10/15  5:50 AM  Result Value Ref Range   WBC 14.9 (H) 4.0 - 10.5 K/uL   RBC 4.06 3.87 - 5.11 MIL/uL   Hemoglobin 12.1 12.0 - 15.0 g/dL   HCT 78.235.8 (L) 95.636.0 - 21.346.0 %   MCV 88.2 78.0 - 100.0 fL   MCH 29.8 26.0 - 34.0 pg   MCHC 33.8 30.0 - 36.0 g/dL   RDW 08.613.1 57.811.5 - 46.915.5 %   Platelets 190 150 - 400 K/uL  Type and screen     Status: None   Collection Time: 02/10/15  5:50 AM  Result Value Ref Range   ABO/RH(D) O NEG    Antibody Screen POS    Sample Expiration 02/13/2015    Antibody Identification PASSIVELY ACQUIRED ANTI-D    DAT, IgG NEG     Assessment / Plan: 1777w6d week IUP Labor: early Fetal Wellbeing:  Category I Pain Control:  Epidural Anticipated MOD:  SVD Pitocin  Dorathy KinsmanVirginia Bellany Elbaum, CNM 02/10/2015 9:52 AM

## 2015-02-10 NOTE — H&P (Signed)
LABOR ADMISSION HISTORY AND PHYSICAL  MARCINA KINNISON is a 24 y.o. female G2P0010 with IUP at [redacted]w[redacted]d by ultrasound presenting for labor. She reports +FMs, No LOF, no VB, no blurry vision, headaches or peripheral edema, and RUQ pain.  She plans on breast feeding. She request POP's for birth control.  Dating: By ultrasound --->  Estimated Date of Delivery: 02/11/15  She started having contractions earlier this morning. They have been getting closer together and becoming stronger. She denies any LOF or vaginal bleeding. Reports good fetal movement.    Prenatal History/Complications:  Past Medical History: Past Medical History  Diagnosis Date  . Psoriasis     Past Surgical History: Past Surgical History  Procedure Laterality Date  . Tonsillectomy and adenoidectomy  2004    Obstetrical History: OB History    Gravida Para Term Preterm AB TAB SAB Ectopic Multiple Living   Social History: History   Social History  . Marital Status: Single    Spouse Name: N/A  . Number of Children: N/A  . Years of Education: N/A   Social History Main Topics  . Smoking status: Former Smoker    Types: Cigarettes  . Smokeless tobacco: Never Used  . Alcohol Use: No  . Drug Use: No  . Sexual Activity: Yes    Birth Control/ Protection: None   Other Topics Concern  . None   Social History Narrative    Family History: Family History  Problem Relation Age of Onset  . Diabetes Paternal Grandmother   . Arthritis Maternal Grandmother     Allergies: No Known Allergies  Prescriptions prior to admission  Medication Sig Dispense Refill Last Dose  . Prenatal Vit-Fe Fumarate-FA (MULTIVITAMIN-PRENATAL) 27-0.8 MG TABS tablet Take 1 tablet by mouth daily at 12 noon.   02/09/2015 at Unknown time     Review of Systems   All systems reviewed and negative except as stated in HPI  Last menstrual period 05/02/2014. General appearance: alert, cooperative and mild  distress Lungs: clear to auscultation bilaterally Heart: regular rate and rhythm Abdomen: soft, non-tender; bowel sounds normal Extremities: no sign of DVT Presentation: cephalic Fetal monitoringBaseline: 140 bpm, Variability: Good {> 6 bpm) and Accelerations: Reactive Uterine activityFrequency: Every 3-5 minutes Dilation: 4.5 Effacement (%): 80 Station: -1 Exam by:: DCALLAWAY, RN   Prenatal labs: ABO, Rh: O/NEG/-- (07/14 1028) Antibody: NEG (11/30 0907) Rubella:   RPR: NON REAC (11/30 0907)  HBsAg: NEGATIVE (07/14 1028)  HIV: NONREACTIVE (11/30 0907)  GBS:    2 hr normal: 72/127/107 Genetic screening  neg Anatomy US normal   [facility]  FOB Damon Tye, 23yo, 1st baby, dating  Dating By 7.5wk u/s  Pap 07/2013: neg per pt, will get report  GC/CT Initial:   -/-             36+wks:  -/-  Genetic Screen NT/IT: neg  CF screen neg  Anatomic [place] Normal female 'Asher'  Flu vaccine 09/27/14  Tdap Recommended ~ 28wks  Glucose Screen  2 hr normal: 72/127/107  GBS neg  Feed Preference breast  Contraception POPs  Circumcision Yes, if boy, at [facility]  Childbirth Classes Interested, info given- thinking about [employer]     No results found for this or any previous visit (from the past 24 hour(s)).  Patient Active Problem List   Diagnosis Date Noted  . Supervision of low-risk pregnancy 02/08/2015  .  Marijuana use 07/07/2014  . Supervision of other normal pregnancy 07/06/2014  . Subchorionic hemorrhage, antepartum 07/06/2014  . Rh negative state in antepartum period 07/06/2014    Assessment: Armond Hanglexandra P Larranaga is a 24 y.o. G2P0010 at 2863w6d here for active labor.   #Labor: progressing spontaneously  #Pain: Epidural  #FWB: Category 1 #ID:  GBS neg #MOF: breast #MOC: considering POP's  #Circ:  Inpatient   Myra RudeSchmitz, Jeremy E 02/10/2015, 5:11 AM   I have seen and examined this patient and I agree with the above. Cam HaiSHAW, KIMBERLY CNM 7:52  AM 02/10/2015

## 2015-02-10 NOTE — MAU Note (Signed)
PT  SAYS SHE  STARTED HURTING  BAD AT 2300.  WAS  1-2  CM IN OFFICE  AT [facility].   DENIES HSV AND MRSA.  GBS-  NEG

## 2015-02-10 NOTE — Progress Notes (Signed)
PIV in right wrist infiltrated.  CRNA called for new IV start.  Anesthesia to return for epidural placement after PIV restarted.

## 2015-02-10 NOTE — Anesthesia Preprocedure Evaluation (Signed)

## 2015-02-11 LAB — CBC
HCT: 33.5 % — ABNORMAL LOW (ref 36.0–46.0)
Hemoglobin: 11.3 g/dL — ABNORMAL LOW (ref 12.0–15.0)
MCH: 30.1 pg (ref 26.0–34.0)
MCHC: 33.7 g/dL (ref 30.0–36.0)
MCV: 89.1 fL (ref 78.0–100.0)
PLATELETS: 180 10*3/uL (ref 150–400)
RBC: 3.76 MIL/uL — AB (ref 3.87–5.11)
RDW: 13.4 % (ref 11.5–15.5)
WBC: 15.1 10*3/uL — AB (ref 4.0–10.5)

## 2015-02-11 LAB — RPR: RPR: NONREACTIVE

## 2015-02-11 MED ORDER — RHO D IMMUNE GLOBULIN 1500 UNIT/2ML IJ SOSY
300.0000 ug | PREFILLED_SYRINGE | Freq: Once | INTRAMUSCULAR | Status: AC
  Administered 2015-02-11: 300 ug via INTRAMUSCULAR
  Filled 2015-02-11: qty 2

## 2015-02-11 MED ORDER — ACETAMINOPHEN 325 MG PO TABS
650.0000 mg | ORAL_TABLET | Freq: Four times a day (QID) | ORAL | Status: DC | PRN
  Administered 2015-02-11: 650 mg via ORAL
  Filled 2015-02-11: qty 2

## 2015-02-11 NOTE — Progress Notes (Signed)
UR chart review completed.  

## 2015-02-11 NOTE — Discharge Instructions (Signed)

## 2015-02-11 NOTE — Anesthesia Postprocedure Evaluation (Signed)
Anesthesia Post Note  Patient: Alejandra Moody  Procedure(s) Performed: * No procedures listed *  Anesthesia type: Epidural  Patient location: Mother/Baby  Post pain: Pain level controlled  Post assessment: Post-op Vital signs reviewed  Last Vitals:  Filed Vitals:   02/11/15 0545  BP: 130/82  Pulse: 97  Temp: 36.8 C  Resp: 18    Post vital signs: Reviewed  Level of consciousness:alert  Complications: No apparent anesthesia complications

## 2015-02-11 NOTE — Progress Notes (Signed)
Clinical Social Work Department PSYCHOSOCIAL ASSESSMENT - MATERNAL/CHILD 02/11/2015  Patient:  Alejandra Moody  Account Number:  [id number]  Admit Date:  02/10/2015  Alejandra Moody Name:   Alejandra Moody   Clinical Social Worker:  Alejandra Moody, CLINICAL SOCIAL WORKER   Date/Time:  02/11/2015 10:15 AM  Date Referred:  02/10/2015   Referral source  Central Nurs[facility]     Referred reason  Substance Abuse   Other referral source:    I:  FAMILY / [place] legal guardian:  PARENT  Guardian - Name Guardian - Age Superior [address] [place], [place] [address]  Alejandra Moody 23 same as above   Other household support members/support persons Other support:   MOB reported strong family support. She stated that the infant has two sets of grandparents and two sets of great-grandparents who are excited for the infant's arrival.    II  PSYCHOSOCIAL DATA Information Source:  Family Interview  Financial and [employer] Employment:   MOB did not disclose employment status.   Financial resources:  Medicaid If Medicaid - County:  [place] / Grade:  N/A Music therapist / Child Services Coordination / Early Interventions:   MOB was referred to [facility].  Cultural issues impacting care:   None reported.    III  STRENGTHS Strengths  Adequate Resources  Home prepared for Child (including basic supplies)  Supportive family/friends   Strength comment:    IV  RISK FACTORS AND CURRENT PROBLEMS Current Problem:  YES   Risk Factor & Current Problem Patient Issue Family Issue Risk Factor / Current Problem Comment  Substance Abuse Y N MOB presents with THC use during the pregnancy. She had a +UDS in July and September.  MOB's UDS was negative in November. The infant's UDS and MDS are pending.    V  SOCIAL WORK ASSESSMENT CSW met with the MOB due to history of substance use during the pregnancy.   The MOB was agreeable to the visit, and she provided consent for her father to be present.  The MOB displayed a full range in affect and presented in a pleasant mood.  She was observed to be interacting and bonding with the infant during the entire visit.    MOB expressed excitement secondary to the arrival of the infant.  She shared that she has enjoyed the experience so far, and discussed that she was most excited when she finally was able to see his face.  She endorsed strong family support that will continue to provide her with support as she transitions home.  MOB presented with insight on normative feelings that may accompany her transition into the postpartum period, including feelings of being overwhelmed and tired. The MOB did express some stress since the infant is not feeding well, but she presented with insight since she discussed awareness that it is a learning experience for both herself and the infant. She did not present as anxious or overwhelmed about the feeding difficulties, and shared belief that she and the infant will take it one day at a time.  MOB denied mental health history or mood symptoms during the pregnancy. She acknowledged education on the baby blues and postpartum depression, and denied any questions or concerns about the information.   MOB acknowledged THC during the pregnancy.  She presented as embarrassed as evidenced by her face turning red and avoiding eye contact when CSW inquired about substance use.  MOB reported that it was "early on", which is congruent with the MOB's drug screens.  MOB verbalized understanding of the hospital drug screen policy, and denied additional questions or concerns.  She is aware that a CPS report will be made if drug screens are positive, and CSW provided education on what to expect if a CPS report is made.  MOB denied additional questions or concerns.    VI SOCIAL WORK PLAN Social Work Therapist, art  No Further  Intervention Required / No Barriers to Discharge   Type of pt/family education:   Postpartum depression  Hospital drug screen policy   If child protective services report - county:   If child protective services report - date:   Information/referral to community resources comment:   No referrals needed.   Other social work plan:   CSW to follow up as needed or upon MOB request.  CSW to monitor UDS/MDS and wil make a CPS if positive.

## 2015-02-11 NOTE — Lactation Note (Signed)
This note was copied from the chart of Alejandra Fraser Dinlexandra Krahl. Lactation Consultation Note: Lactation Brochure given with basis breastfeeding teaching done. Infant is 24 hours old and has had 3-4 5-10 min feedings . Parents state they have attempt to feed today but infant is sleepy with each attempt. Lots of support and teaching with parents. Mothers nipples are semi flat. She has a positional strip on the Rt nipple. She was taught hand expression and observed good flow of colostrum. Several attempts to latch infant. He bounces on and off with only a few sucks. Mother was fit with a #24 nipple shield. Infant latched well with deep latch. Infant was given 5 ml of colostrum with a spoon and another 2 ml with a curved tip syringe through the nipple shield. Mother was given a hand pump with instructions and inverted nipple shells. Advised mother to feed infant 8-12 times in 24 hours. discussed cue base feeding and cluster feeding. Parents were given supplemental guidelines for EBM as needed. Mother taught proper application of nipple shield, Teaching from Baby and me book . Parents very receptive to all teaching. Informed parents of available LC services and community support.   Patient Name: Alejandra Moody ZOXWR'UToday's Date: 02/11/2015 Reason for consult: Follow-up assessment   Maternal Data Has patient been taught Hand Expression?: Yes Does the patient have breastfeeding experience prior to this delivery?: No  Feeding Feeding Type: Breast Fed Length of feed: 15 min  LATCH Score/Interventions Latch: Grasps breast easily, tongue down, lips flanged, rhythmical sucking. Intervention(s): Adjust position;Assist with latch;Breast compression  Audible Swallowing: A few with stimulation Intervention(s): Skin to skin  Type of Nipple: Flat Intervention(s): Shells  Comfort (Breast/Nipple): Soft / non-tender     Hold (Positioning): Assistance needed to correctly position infant at breast and maintain  latch. Intervention(s): Support Pillows;Position options  LATCH Score: 7  Lactation Tools Discussed/Used Tools: Nipple Shields Nipple shield size: 24   Consult Status Consult Status: Follow-up Date: 02/11/15 Follow-up type: In-patient    Stevan BornKendrick, [name] [facility] 02/11/2015, 4:23 PM

## 2015-02-11 NOTE — Discharge Summary (Signed)
Obstetric Discharge Summary Reason for Admission: onset of labor Prenatal Procedures: none Intrapartum Procedures: spontaneous vaginal delivery Postpartum Procedures: none Complications-Operative and Postpartum: none HEMOGLOBIN  Date Value Ref Range Status  02/11/2015 11.3* 12.0 - 15.0 g/dL Final   HCT  Date Value Ref Range Status  02/11/2015 33.5* 36.0 - 46.0 % Final   Patient is 24 y.o. G2P0010 7471w6d admitted 02/10/15.  Delivery Note At 3:33 PM a healthy female was delivered via Vaginal, Spontaneous Delivery (Presentation: Right Occiput Anterior). APGAR: 8, 9; weight pending Placenta status: Intact, Spontaneous. Cord: 3 vessels with the following complications: None.  Anesthesia: Epidural  Episiotomy: None Lacerations: Superficial bilat labia, hemostatic.  Suture Repair:N/A  Mom to postpartum. Baby to Couplet care / Skin to Skin. Placenta to: BS Feeding: Breast Circ: OP Contraception: undecided   Physical Exam:  General: alert and cooperative Lochia: appropriate Uterine Fundus: firm Incision: n/a DVT Evaluation: No evidence of DVT seen on physical exam.  Discharge Diagnoses: Term Pregnancy-delivered  Discharge Information: Date: 02/11/2015 Activity: pelvic rest Diet: routine Medications: None Condition: stable Instructions: refer to practice specific booklet Discharge to: home Follow-up Information    [facility]   Specialty:  Obstetrics and Gynecology   Contact information:   [address] [place] [id number] [phone]      Newborn Data: Live born female  Birth Weight: 7 lb 9 oz (3430 g) APGAR: 8, 9  Home with mother.  Alejandra Moody Alejandra Moody 02/11/2015, 7:30 AM

## 2015-02-12 LAB — RH IG WORKUP (INCLUDES ABO/RH)
ABO/RH(D): O NEG
Fetal Screen: NEGATIVE
Gestational Age(Wks): 39
Unit division: 0

## 2015-02-12 MED ORDER — IBUPROFEN 600 MG PO TABS: 600.0000 mg | ORAL_TABLET | Freq: Four times a day (QID) | ORAL | Status: DC | PRN

## 2015-02-12 NOTE — Discharge Summary (Signed)
  Obstetric Discharge Summary Reason for Admission: onset of labor Prenatal Procedures: none Intrapartum Procedures: spontaneous vaginal delivery Postpartum Procedures: none Complications-Operative and Postpartum: none  Last Labs    HEMOGLOBIN  Date Value Ref Range Status  02/11/2015 11.3* 12.0 - 15.0 g/dL Final   HCT  Date Value Ref Range Status  02/11/2015 33.5* 36.0 - 46.0 % Final     Patient is 24 y.o. G2P0010 [id number] admitted 02/10/15. Pt had been planning on d/c 2/19, but baby was kept to be under the bililights.  Delivery Note At 3:33 PM a healthy female was delivered via Vaginal, Spontaneous Delivery (Presentation: Right Occiput Anterior). APGAR: 8, 9; weight pending Placenta status: Intact, Spontaneous. Cord: 3 vessels with the following complications: None.  Anesthesia: Epidural  Episiotomy: None Lacerations: Superficial bilat labia, hemostatic.  Suture Repair:N/A  Mom to postpartum. Baby to Couplet care / Skin to Skin. Placenta to: BS Feeding: Breast Circ: OP Contraception: undecided   Physical Exam:  General: alert and cooperative Lochia: appropriate Uterine Fundus: firm Incision: n/a DVT Evaluation: No evidence of DVT seen on physical exam.  Discharge Diagnoses: Term Pregnancy-delivered  Discharge Information: Date: 02/12/2015 Activity: pelvic rest Diet: routine Medications: None Condition: stable Instructions: refer to practice specific booklet Discharge to: home Follow-up Information    [facility] weeks.   Specialty: Obstetrics and Gynecology   Contact information:   [address] [place] [id number] [phone]      Newborn Data: Live born female  Birth Weight: 7 lb 9 oz (3430 g) APGAR: 8, 9  Home with mother.  Cam HaiSHAW, Rabab Currington CNM 02/12/2015 9:09 AM

## 2015-02-13 ENCOUNTER — Ambulatory Visit: Payer: Self-pay

## 2015-02-13 NOTE — Lactation Note (Signed)
This note was copied from the chart of Alejandra Moody. Lactation Consultation Note: follow up visit with mom. She reports that baby just finished feeding and she just got him back to sleep. Reports he is doing well with NS- she seems milk in NS when he comes off the breast. Reports breasts are feeling fuller this morning. Asking about pumping Offered DEBP and mom agreeable. Reports he nursed on one breast and the other one feels pretty full. DEBP set up for mom with instructions. Pumping as I left room- obtaining transitional milk. Has already ordered a pump from her insurance company. No questions at present. To call for assist prn  Patient Name: Alejandra Fraser Dinlexandra Ballo [id number]'WToday's Date: 02/13/2015 Reason for consult: Follow-up assessment   Maternal Data Formula Feeding for Exclusion: No Has patient been taught Hand Expression?: Yes Does the patient have breastfeeding experience prior to this delivery?: No  Feeding Length of feed: 35 min  LATCH Score/Interventions Latch: Grasps breast easily, tongue down, lips flanged, rhythmical sucking.  Audible Swallowing: Spontaneous and intermittent  Type of Nipple: Flat  Comfort (Breast/Nipple): Filling, red/small blisters or bruises, mild/mod discomfort     Hold (Positioning): No assistance needed to correctly position infant at breast.  LATCH Score: 8  Lactation Tools Discussed/Used Nipple shield size: 24 WIC Program: No Pump Review: Setup, frequency, and cleaning Initiated by:: DW Date initiated:: 02/13/15   Consult Status Consult Status: Follow-up Date: 02/14/15 Follow-up type: In-patient    Pamelia HoitWeeks, Takhia Spoon D 02/13/2015, 1:53 PM

## 2015-02-14 ENCOUNTER — Encounter: Payer: 59 | Admitting: Women's Health

## 2015-02-14 LAB — TYPE AND SCREEN
ABO/RH(D): O NEG
ANTIBODY SCREEN: POSITIVE
DAT, IGG: NEGATIVE
UNIT DIVISION: 0
UNIT DIVISION: 0

## 2016-04-08 ENCOUNTER — Encounter (HOSPITAL_COMMUNITY): Payer: Self-pay | Admitting: *Deleted

## 2016-04-08 ENCOUNTER — Emergency Department (HOSPITAL_COMMUNITY): Payer: 59

## 2016-04-08 ENCOUNTER — Emergency Department (HOSPITAL_COMMUNITY)
Admission: EM | Admit: 2016-04-08 | Discharge: 2016-04-08 | Disposition: A | Discharge status: Home / Self Care | Payer: 59 | Attending: Emergency Medicine | Admitting: Emergency Medicine

## 2016-04-08 DIAGNOSIS — Z87891 Personal history of nicotine dependence: Secondary | ICD-10-CM | POA: Insufficient documentation

## 2016-04-08 DIAGNOSIS — F10220 Alcohol dependence with intoxication, uncomplicated: Secondary | ICD-10-CM | POA: Insufficient documentation

## 2016-04-08 DIAGNOSIS — Y9389 Activity, other specified: Secondary | ICD-10-CM | POA: Insufficient documentation

## 2016-04-08 DIAGNOSIS — Z041 Encounter for examination and observation following transport accident: Secondary | ICD-10-CM | POA: Diagnosis present

## 2016-04-08 DIAGNOSIS — Y9289 Other specified places as the place of occurrence of the external cause: Secondary | ICD-10-CM | POA: Diagnosis not present

## 2016-04-08 DIAGNOSIS — R21 Rash and other nonspecific skin eruption: Secondary | ICD-10-CM | POA: Diagnosis not present

## 2016-04-08 DIAGNOSIS — R4182 Altered mental status, unspecified: Secondary | ICD-10-CM | POA: Diagnosis not present

## 2016-04-08 DIAGNOSIS — Y999 Unspecified external cause status: Secondary | ICD-10-CM | POA: Diagnosis not present

## 2016-04-08 DIAGNOSIS — E876 Hypokalemia: Secondary | ICD-10-CM | POA: Diagnosis not present

## 2016-04-08 DIAGNOSIS — F10920 Alcohol use, unspecified with intoxication, uncomplicated: Secondary | ICD-10-CM

## 2016-04-08 LAB — COMPREHENSIVE METABOLIC PANEL
ALBUMIN: 4.8 g/dL (ref 3.5–5.0)
ALK PHOS: 64 U/L (ref 38–126)
ALT: 16 U/L (ref 14–54)
AST: 17 U/L (ref 15–41)
Anion gap: 11 (ref 5–15)
BUN: 9 mg/dL (ref 6–20)
CALCIUM: 9 mg/dL (ref 8.9–10.3)
CO2: 24 mmol/L (ref 22–32)
Chloride: 109 mmol/L (ref 101–111)
Creatinine, Ser: 0.61 mg/dL (ref 0.44–1.00)
GFR calc non Af Amer: >60 mL/min (ref >60)
GLUCOSE: 84 mg/dL (ref 65–99)
Potassium: 3 mmol/L — ABNORMAL LOW (ref 3.5–5.1)
Sodium: 144 mmol/L (ref 135–145)
Total Bilirubin: 0.4 mg/dL (ref 0.3–1.2)
Total Protein: 8 g/dL (ref 6.5–8.1)

## 2016-04-08 LAB — CBC WITH DIFFERENTIAL/PLATELET
BASOS ABS: 0 10*3/uL (ref 0.0–0.1)
Basophils Relative: 1 %
Eosinophils Absolute: 0 10*3/uL (ref 0.0–0.7)
Eosinophils Relative: 1 %
HEMATOCRIT: 45.5 % (ref 36.0–46.0)
HEMOGLOBIN: 15.6 g/dL — AB (ref 12.0–15.0)
LYMPHS ABS: 2.4 10*3/uL (ref 0.7–4.0)
Lymphocytes Relative: 29 %
MCH: 30.9 pg (ref 26.0–34.0)
MCHC: 34.3 g/dL (ref 30.0–36.0)
MCV: 90.1 fL (ref 78.0–100.0)
Monocytes Absolute: 0.7 10*3/uL (ref 0.1–1.0)
Monocytes Relative: 8 %
NEUTROS PCT: 61 %
Neutro Abs: 5.2 10*3/uL (ref 1.7–7.7)
Platelets: 226 10*3/uL (ref 150–400)
RBC: 5.05 MIL/uL (ref 3.87–5.11)
RDW: 12.2 % (ref 11.5–15.5)
WBC: 8.4 10*3/uL (ref 4.0–10.5)

## 2016-04-08 LAB — I-STAT BETA HCG BLOOD, ED (MC, WL, AP ONLY): I-stat hCG, quantitative: <5 m[IU]/mL (ref <5)

## 2016-04-08 LAB — LACTIC ACID, PLASMA: Lactic Acid, Venous: 1.8 mmol/L (ref 0.5–2.0)

## 2016-04-08 LAB — ETHANOL: ALCOHOL ETHYL (B): 303 mg/dL — AB (ref <5)

## 2016-04-08 LAB — RAPID URINE DRUG SCREEN, HOSP PERFORMED
Amphetamines: NOT DETECTED
BENZODIAZEPINES: NOT DETECTED
Barbiturates: NOT DETECTED
COCAINE: NOT DETECTED
Opiates: NOT DETECTED
Tetrahydrocannabinol: POSITIVE — AB

## 2016-04-08 LAB — TROPONIN I

## 2016-04-08 MED ORDER — NAPROXEN 500 MG PO TABS: ORAL_TABLET | ORAL | Status: DC

## 2016-04-08 MED ORDER — POTASSIUM CHLORIDE CRYS ER 20 MEQ PO TBCR
40.0000 meq | EXTENDED_RELEASE_TABLET | Freq: Once | ORAL | Status: AC
  Administered 2016-04-08: 40 meq via ORAL
  Filled 2016-04-08: qty 2

## 2016-04-08 MED ORDER — CYCLOBENZAPRINE HCL 5 MG PO TABS: 5.0000 mg | ORAL_TABLET | Freq: Three times a day (TID) | ORAL | Status: DC | PRN

## 2016-04-08 MED ORDER — SODIUM CHLORIDE 0.9 % IV SOLN
INTRAVENOUS | Status: DC
  Administered 2016-04-08: 1000 mL via INTRAVENOUS

## 2016-04-08 MED ORDER — POTASSIUM CHLORIDE CRYS ER 20 MEQ PO TBCR: 20.0000 meq | EXTENDED_RELEASE_TABLET | Freq: Two times a day (BID) | ORAL | Status: DC

## 2016-04-08 NOTE — ED Notes (Signed)
CRITICAL VALUE ALERT  Critical value received:  ETOH  303  Date of notification:  04/08/16  Time of notification:  0248  Critical value read back:Yes.    Nurse who received alert:  Thornton Daleson L Vallorie Niccoli, RN  MD notified (1st page):  Lynelle DoctorKnapp  Time of first page:  0249  MD notified (2nd page):  Time of second page:  Responding MD:  Lynelle DoctorKnapp  Time MD responded:  [phone]
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
## 2016-04-08 NOTE — ED Notes (Signed)
Pt states she want to leave. Pt informed that the EDP wants to revaulate her condition after she sobers up. Pt says she is fine.
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
## 2016-04-08 NOTE — ED Notes (Signed)
Pt alert & oriented x4, stable gait. Patient given discharge instructions, paperwork & prescription(s). Patient instructed to stop at the registration desk to finish any additional paperwork. Patient verbalized understanding. Pt left department w/ boyfriend. Pt had no further questions.
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
## 2020-04-06 ENCOUNTER — Emergency Department (HOSPITAL_COMMUNITY): Payer: 59

## 2020-04-06 ENCOUNTER — Other Ambulatory Visit: Payer: Self-pay

## 2020-04-06 ENCOUNTER — Emergency Department (HOSPITAL_COMMUNITY)
Admission: EM | Admit: 2020-04-06 | Discharge: 2020-04-07 | Disposition: A | Discharge status: Home / Self Care | Payer: 59 | Attending: Emergency Medicine | Admitting: Emergency Medicine

## 2020-04-06 ENCOUNTER — Encounter (HOSPITAL_COMMUNITY): Payer: Self-pay | Admitting: Emergency Medicine

## 2020-04-06 DIAGNOSIS — Y999 Unspecified external cause status: Secondary | ICD-10-CM | POA: Diagnosis not present

## 2020-04-06 DIAGNOSIS — Y9389 Activity, other specified: Secondary | ICD-10-CM | POA: Diagnosis not present

## 2020-04-06 DIAGNOSIS — S52532A Colles' fracture of left radius, initial encounter for closed fracture: Secondary | ICD-10-CM | POA: Diagnosis not present

## 2020-04-06 DIAGNOSIS — Z79899 Other long term (current) drug therapy: Secondary | ICD-10-CM | POA: Insufficient documentation

## 2020-04-06 DIAGNOSIS — S6992XA Unspecified injury of left wrist, hand and finger(s), initial encounter: Secondary | ICD-10-CM | POA: Diagnosis present

## 2020-04-06 DIAGNOSIS — T148XXA Other injury of unspecified body region, initial encounter: Secondary | ICD-10-CM | POA: Insufficient documentation

## 2020-04-06 DIAGNOSIS — Y92009 Unspecified place in unspecified non-institutional (private) residence as the place of occurrence of the external cause: Secondary | ICD-10-CM | POA: Diagnosis not present

## 2020-04-06 DIAGNOSIS — W19XXXA Unspecified fall, initial encounter: Secondary | ICD-10-CM

## 2020-04-06 DIAGNOSIS — Z87891 Personal history of nicotine dependence: Secondary | ICD-10-CM | POA: Insufficient documentation

## 2020-04-06 MED ORDER — FENTANYL CITRATE (PF) 100 MCG/2ML IJ SOLN
50.0000 ug | Freq: Once | INTRAMUSCULAR | Status: AC
  Administered 2020-04-07: 50 ug via INTRAMUSCULAR
  Filled 2020-04-06: qty 2
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
## 2020-04-07 ENCOUNTER — Emergency Department (HOSPITAL_COMMUNITY): Payer: 59

## 2020-04-07 MED ORDER — OXYCODONE-ACETAMINOPHEN 5-325 MG PO TABS: 1.0000 | ORAL_TABLET | Freq: Four times a day (QID) | ORAL | 0 refills | Status: DC | PRN

## 2020-04-07 MED ORDER — OXYCODONE-ACETAMINOPHEN 5-325 MG PO TABS
1.0000 | ORAL_TABLET | Freq: Once | ORAL | Status: AC
  Administered 2020-04-07: 01:00:00 1 via ORAL
  Filled 2020-04-07: qty 1

## 2020-04-07 MED ORDER — IBUPROFEN 400 MG PO TABS
600.0000 mg | ORAL_TABLET | Freq: Once | ORAL | Status: AC
  Administered 2020-04-07: 600 mg via ORAL
  Filled 2020-04-07: qty 2

## 2020-04-07 NOTE — Discharge Instructions (Addendum)
Elevate your hand, use ice packs to reduce swelling and for comfort.  Take ibuprofen 600 mg 4 times a day.  Take the Percocet as needed for severe pain.  Wear the splint until you are rechecked by the orthopedist on-call.  Dr. Romeo Apple is the orthopedist in [place].  Call his office to get an appointment to be seen in the next week.  The fingernail injury is on the distal part of the nail, as soon as it grows out far enough you can clip it off.

## 2020-04-08 ENCOUNTER — Ambulatory Visit: Payer: 59 | Admitting: Orthopedic Surgery

## 2020-04-08 ENCOUNTER — Other Ambulatory Visit: Payer: Self-pay

## 2020-04-08 ENCOUNTER — Encounter: Payer: Self-pay | Admitting: Orthopedic Surgery

## 2020-04-08 VITALS — BP 128/76 | HR 84 | Temp 97.1°F | Ht 63.0 in | Wt 183.0 lb

## 2020-04-08 DIAGNOSIS — S52532A Colles' fracture of left radius, initial encounter for closed fracture: Secondary | ICD-10-CM | POA: Diagnosis not present

## 2020-04-08 MED ORDER — OXYCODONE-ACETAMINOPHEN 5-325 MG PO TABS: 1.0000 | ORAL_TABLET | ORAL | 0 refills | Status: AC | PRN

## 2020-04-08 MED ORDER — IBUPROFEN 800 MG PO TABS: 800.0000 mg | ORAL_TABLET | Freq: Three times a day (TID) | ORAL | 1 refills | Status: DC | PRN

## 2020-04-08 NOTE — Patient Instructions (Addendum)
WORK NOTE OOW X 1 WEEK MAY RETURN IF NOT CARRYING TRAYS   ICE   ELEVATION  MOVE THE FINGERS  TAKE IBUPROFEN AND YOUR PAIN MEDICINE   Cast or Splint Care, Adult Casts and splints are supports that are worn to protect broken bones and other injuries. A cast or splint may hold a bone still and in the correct position while it heals. Casts and splints may also help ease pain, swelling, and muscle spasms. A cast is a hardened support that is usually made of fiberglass or plaster. It is custom-fit to the body and it offers more protection than a splint. It cannot be taken off and put back on. A splint is a type of soft support that is usually made from cloth and elastic. It can be adjusted or taken off as needed. You may need a cast or a splint if you:  Have a broken bone.  Have a soft-tissue injury.  Need to keep an injured body part from moving (keep it immobile) after surgery. How is this treated? If you have a cast:   Do not stick anything inside the cast to scratch your skin. Sticking something in the cast increases your risk of infection.  Check the skin around the cast every day. Tell your health care provider about any concerns.  You may put lotion on dry skin around the edges of the cast. Do not put lotion on the skin underneath the cast.  Keep the cast clean.  If the cast is not waterproof: ? Do not let it get wet. ? Cover it with a watertight covering when you take a bath or a shower. If you have a splint:   Wear it as told by your health care provider. Remove it only as told by your health care provider.  Loosen the splint if your fingers or toes tingle, become numb, or turn cold and blue.  Keep the splint clean.  If the splint is not waterproof: ? Do not let it get wet. ? Cover it with a watertight covering when you take a bath or a shower. Bathing  Do not take baths or swim until your health care provider approves. Ask your health care provider if you can take  showers. You may only be allowed to take sponge baths for bathing.  If your cast or splint is not waterproof, cover it with a watertight covering when you take a bath or shower. Managing pain, stiffness, and swelling  Move your fingers or toes often to avoid stiffness and to lessen swelling.  Raise (elevate) the injured area above the level of your heart while sitting or lying down. Safety  Do not use the injured limb to support your body weight until your health care provider says that it is okay.  Use crutches or other assistive devices as told by your health care provider. General instructions  Do not put pressure on any part of the cast or splint until it is fully hardened. This may take several hours.  Return to your normal activities as told by your health care provider. Ask your health care provider what activities are safe for you.  Take over-the-counter and prescription medicines only as told by your health care provider.  Keep all follow-up visits as told by your health care provider. This is important. Contact a health care provider if:  Your cast or splint gets damaged.  The skin around the cast gets red or raw.  The skin under the cast is  extremely itchy or painful.  Your cast or splint feels very uncomfortable.  Your cast or splint is too tight or too loose.  Your cast becomes wet or it develops a soft spot or area.  You get an object stuck under your cast. Get help right away if:  Your pain is getting worse.  The injured area tingles, becomes numb, or turns cold and blue.  The part of your body above or below the cast is swollen and discolored.  You cannot feel or move your fingers or toes.  There is fluid leaking through the cast.  You have severe pain or pressure under the cast.  You have trouble breathing.  You have shortness of breath.  You have chest pain. This information is not intended to replace advice given to you by your health care  provider. Make sure you discuss any questions you have with your health care provider. Document Revised: 10/07/2017 Document Reviewed: 06/02/2016 Elsevier Patient Education  2020 [employer].
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
## 2020-04-29 ENCOUNTER — Ambulatory Visit (INDEPENDENT_AMBULATORY_CARE_PROVIDER_SITE_OTHER): Payer: 59 | Admitting: Orthopedic Surgery

## 2020-04-29 ENCOUNTER — Ambulatory Visit: Payer: 59

## 2020-04-29 ENCOUNTER — Encounter: Payer: Self-pay | Admitting: Orthopedic Surgery

## 2020-04-29 ENCOUNTER — Other Ambulatory Visit: Payer: Self-pay

## 2020-04-29 DIAGNOSIS — S62102D Fracture of unspecified carpal bone, left wrist, subsequent encounter for fracture with routine healing: Secondary | ICD-10-CM | POA: Diagnosis not present
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
## 2020-04-29 NOTE — Patient Instructions (Signed)
Wear the brace full-time including sleeping only take it off for shower

## 2020-05-25 DIAGNOSIS — S62102D Fracture of unspecified carpal bone, left wrist, subsequent encounter for fracture with routine healing: Secondary | ICD-10-CM | POA: Insufficient documentation

## 2020-05-27 ENCOUNTER — Encounter: Payer: Self-pay | Admitting: Orthopedic Surgery

## 2020-05-27 ENCOUNTER — Other Ambulatory Visit: Payer: Self-pay

## 2020-05-27 ENCOUNTER — Ambulatory Visit (INDEPENDENT_AMBULATORY_CARE_PROVIDER_SITE_OTHER): Payer: 59 | Admitting: Orthopedic Surgery

## 2020-05-27 ENCOUNTER — Ambulatory Visit: Payer: 59

## 2020-05-27 DIAGNOSIS — S62102D Fracture of unspecified carpal bone, left wrist, subsequent encounter for fracture with routine healing: Secondary | ICD-10-CM | POA: Diagnosis not present
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
## 2022-04-28 ENCOUNTER — Ambulatory Visit
Admission: EM | Admit: 2022-04-28 | Discharge: 2022-04-28 | Disposition: A | Discharge status: Home / Self Care | Payer: 59 | Attending: Family Medicine | Admitting: Family Medicine

## 2022-04-28 DIAGNOSIS — J029 Acute pharyngitis, unspecified: Secondary | ICD-10-CM

## 2022-04-28 DIAGNOSIS — J069 Acute upper respiratory infection, unspecified: Secondary | ICD-10-CM

## 2022-04-28 MED ORDER — PROMETHAZINE-DM 6.25-15 MG/5ML PO SYRP: 5.0000 mL | ORAL_SOLUTION | Freq: Four times a day (QID) | ORAL | 0 refills | Status: DC | PRN

## 2022-04-28 NOTE — ED Provider Notes (Signed)
?  [facility] ? ? ?[id number] ?04/28/22 Arrival Time: [phone] ? ?ASSESSMENT & PLAN: ? ?1. Viral URI with cough   ?2. Sore throat   ? ?Discussed typical duration of viral illnesses. ?OTC symptom care as needed. ? ?Discharge Medication List as of 04/28/2022  8:57 AM  ?  ? ?START taking these medications  ? Details  ?promethazine-dextromethorphan (PROMETHAZINE-DM) 6.25-15 MG/5ML syrup Take 5 mLs by mouth 4 (four) times daily as needed for cough., Starting Sat 04/28/2022, Normal  ?  ?  ? ? ? Follow-up Information   ? ? [facility].   ?Specialty: Urgent Care ?Why: If worsening or failing to improve as anticipated. ?Contact information: ?[address], [address] ?[place] [address] ?[phone] ? ?  ?  ? ?  ?  ? ?  ? ? ?Reviewed expectations re: course of current medical issues. Questions answered. ?Outlined signs and symptoms indicating need for more acute intervention. ?Understanding verbalized. ?After Visit Summary given. ? ? ?SUBJECTIVE: ?History from: Patient. ?Alejandra Moody is a 31 y.o. female. Reports: ST, mild cough, watery eyes. Children with same earlier in week; ear pressure. Afebrile. Denies: difficulty breathing. Normal PO intake without n/v/d. ? ?OBJECTIVE: ? ?Vitals:  ? 04/28/22 0839  ?BP: 136/83  ?Pulse: 95  ?Resp: 18  ?Temp: 99.1 ?F (37.3 ?C)  ?TempSrc: Oral  ?SpO2: 98%  ?  ?General appearance: alert; no distress ?Eyes: PERRLA; EOMI; conjunctiva normal ?HENT: [place]; AT; with nasal congestion; throat cobblestoning ?Neck: supple  ?Lungs: speaks full sentences without difficulty; unlabored ?Extremities: no edema ?Skin: warm and dry ?Neurologic: normal gait ?Psychological: alert and cooperative; normal mood and affect ? ? ?No Known Allergies ? ?Past Medical History:  ?Diagnosis Date  ? Psoriasis   ? ?Social History  ? ?Socioeconomic History  ? Marital status: Married  ?  Spouse name: Not on file  ? Number of children: Not on file  ? Years of education: Not on  file  ? Highest education level: Not on file  ?Occupational History  ? Not on file  ?Tobacco Use  ? Smoking status: Former  ?  Types: Cigarettes  ? Smokeless tobacco: Never  ?Vaping Use  ? Vaping Use: Never used  ?Substance and Sexual Activity  ? Alcohol use: Yes  ?  Comment: Occas  ? Drug use: No  ? Sexual activity: Yes  ?  Birth control/protection: None  ?Other Topics Concern  ? Not on file  ?Social History Narrative  ? Not on file  ? ?Social Determinants of Health  ? ?Financial Resource Strain: Not on file  ?Food Insecurity: Not on file  ?Transportation Needs: Not on file  ?Physical Activity: Not on file  ?Stress: Not on file  ?Social Connections: Not on file  ?Intimate Partner Violence: Not on file  ? ?Family History  ?Problem Relation Age of Onset  ? Diabetes Paternal Grandmother   ? Arthritis Maternal Grandmother   ? ?Past Surgical History:  ?Procedure Laterality Date  ? TONSILLECTOMY AND ADENOIDECTOMY  2004  ? ?  ?Mardella Layman, MD ?04/28/22 [phone] ? ?

## 2022-04-28 NOTE — ED Triage Notes (Signed)
Pt states that 2 days ago her throat started hurting, nose burning bad, eyes stuck together and both ears are hurting ? ?Pt states she took an at Home Covid test and it was negative ? ?Pt states that her kids did have ear infections last week ? ?Denies Meds ? ?Denies Fever ? ? ?

## 2022-12-24 NOTE — L&D Delivery Note (Addendum)
Delivery Note MARYLINDA BORTNICK is a 32 y.o. [id number] at [redacted]w[redacted]d admitted for SOL SROM.   GBS Status: Negative/-- (07/25 1400)  Labor course: Initial SVE: 6. Augmentation with:  rupture of forebag . She then progressed to complete.  ROM: 4h 18m with clear fluid  Birth: At 841 a viable female was delivered via spontaneous vaginal delivery LOA. Nuchal cord present: No.  Shoulders and body delivered in usual fashion. Infant placed directly on mom's abdomen for bonding/skin-to-skin, baby dried and stimulated. Cord clamped x 2 after 1 minute and cut by FOB.  Cord blood collected.  The placenta separated spontaneously and delivered via gentle cord traction.  Pitocin infused rapidly IV per protocol.  Fundus firm with massage.  Placenta inspected and appears to be intact with a 3 VC.  Placenta/Cord with the following complications: none .  Cord pH: none Sponge and instrument count were correct x2.  Intrapartum complications:  None Anesthesia:  none Episiotomy: none Lacerations:  none Suture Repair:  none EBL (mL): 112   Infant: APGAR (1 MIN):  9 APGAR (5 MINS):  9 APGAR (10 MINS):    Infant weight: pending  Delivery Report: Review the Delivery Report for details.    Mom to postpartum.  Baby to Couplet care / Skin to Skin. Placenta to L&D   Plans to Breastfeed Contraception: POPs Circumcision: wants inpatient  Note sent to [facility]: FT for pp visit.  Cleda Mccreedy [facility] 08/09/2023 8:50 AM  The above was performed under my direct supervision and guidance.

## 2023-02-05 ENCOUNTER — Encounter: Payer: Self-pay | Admitting: Obstetrics and Gynecology

## 2023-02-05 ENCOUNTER — Ambulatory Visit (INDEPENDENT_AMBULATORY_CARE_PROVIDER_SITE_OTHER): Payer: 59 | Admitting: Obstetrics and Gynecology

## 2023-02-05 VITALS — BP 133/77 | HR 124 | Ht 63.0 in | Wt 193.2 lb

## 2023-02-05 DIAGNOSIS — Z3201 Encounter for pregnancy test, result positive: Secondary | ICD-10-CM | POA: Diagnosis not present

## 2023-02-05 DIAGNOSIS — N926 Irregular menstruation, unspecified: Secondary | ICD-10-CM

## 2023-02-05 LAB — POCT URINE PREGNANCY: Preg Test, Ur: POSITIVE — AB

## 2023-02-05 NOTE — Progress Notes (Signed)
Pt presents with positive pregnancy and to establish care Normal cycles TSVD x 1 without problems No chronic medical problems or medications  PE AF VSS Lungs clear Heart RRR Abd soft + BS  UPT +  A/P Positive pregnancy test  Congratulations extended. Start PNV qd. Scheduled new OB visit

## 2023-02-14 ENCOUNTER — Other Ambulatory Visit: Payer: Self-pay | Admitting: Obstetrics & Gynecology

## 2023-02-14 DIAGNOSIS — O3680X0 Pregnancy with inconclusive fetal viability, not applicable or unspecified: Secondary | ICD-10-CM

## 2023-02-15 ENCOUNTER — Ambulatory Visit (INDEPENDENT_AMBULATORY_CARE_PROVIDER_SITE_OTHER): Payer: 59

## 2023-02-15 DIAGNOSIS — O3680X0 Pregnancy with inconclusive fetal viability, not applicable or unspecified: Secondary | ICD-10-CM | POA: Diagnosis not present

## 2023-02-15 DIAGNOSIS — Z3A11 11 weeks gestation of pregnancy: Secondary | ICD-10-CM

## 2023-02-15 NOTE — Progress Notes (Signed)
[place] 14+6 wks,single IUP,posterior placenta,normal ovaries,FHR 162 bpm

## 2023-02-19 ENCOUNTER — Other Ambulatory Visit: Payer: 59

## 2023-03-05 ENCOUNTER — Encounter: Payer: 59 | Admitting: *Deleted

## 2023-03-05 ENCOUNTER — Encounter: Payer: 59 | Admitting: Women's Health

## 2023-03-05 DIAGNOSIS — O0932 Supervision of pregnancy with insufficient antenatal care, second trimester: Secondary | ICD-10-CM | POA: Insufficient documentation

## 2023-03-05 DIAGNOSIS — Z3490 Encounter for supervision of normal pregnancy, unspecified, unspecified trimester: Secondary | ICD-10-CM | POA: Insufficient documentation

## 2023-03-05 DIAGNOSIS — Z6791 Unspecified blood type, Rh negative: Secondary | ICD-10-CM | POA: Insufficient documentation

## 2023-05-27 ENCOUNTER — Telehealth: Payer: Self-pay

## 2023-05-27 NOTE — Telephone Encounter (Signed)
Called patient about appt on 6/4. Phone isn't working.

## 2023-05-28 ENCOUNTER — Ambulatory Visit (INDEPENDENT_AMBULATORY_CARE_PROVIDER_SITE_OTHER): Payer: 59 | Admitting: Women's Health

## 2023-05-28 ENCOUNTER — Encounter: Payer: Self-pay | Admitting: Women's Health

## 2023-05-28 ENCOUNTER — Encounter: Payer: 59 | Admitting: *Deleted

## 2023-05-28 VITALS — BP 113/74 | HR 97 | Wt 191.0 lb

## 2023-05-28 DIAGNOSIS — Z23 Encounter for immunization: Secondary | ICD-10-CM

## 2023-05-28 DIAGNOSIS — Z3480 Encounter for supervision of other normal pregnancy, unspecified trimester: Secondary | ICD-10-CM

## 2023-05-28 DIAGNOSIS — Z3490 Encounter for supervision of normal pregnancy, unspecified, unspecified trimester: Secondary | ICD-10-CM | POA: Insufficient documentation

## 2023-05-28 DIAGNOSIS — Z3482 Encounter for supervision of other normal pregnancy, second trimester: Secondary | ICD-10-CM

## 2023-05-28 DIAGNOSIS — O0933 Supervision of pregnancy with insufficient antenatal care, third trimester: Secondary | ICD-10-CM | POA: Diagnosis not present

## 2023-05-28 DIAGNOSIS — Z3A29 29 weeks gestation of pregnancy: Secondary | ICD-10-CM | POA: Diagnosis not present

## 2023-05-28 DIAGNOSIS — O0932 Supervision of pregnancy with insufficient antenatal care, second trimester: Secondary | ICD-10-CM | POA: Insufficient documentation

## 2023-05-28 DIAGNOSIS — Z131 Encounter for screening for diabetes mellitus: Secondary | ICD-10-CM | POA: Diagnosis not present

## 2023-05-28 DIAGNOSIS — Z3483 Encounter for supervision of other normal pregnancy, third trimester: Secondary | ICD-10-CM

## 2023-05-28 NOTE — Progress Notes (Signed)
INITIAL OBSTETRICAL VISIT Patient name: Alejandra Moody MRN [id number]  Date of birth: [birth year] Chief Complaint:   Initial Prenatal Visit  History of Present Illness:   Alejandra Moody is a 32 y.o. G52P3013 Caucasian female at [redacted]w[redacted]d by [place] at 14 weeks with an Estimated Date of Delivery: 08/10/23 being seen today for her initial obstetrical visit.   Patient's last menstrual period was 11/26/2022. Her obstetrical history is significant for  SAB x 1, term uncomplicated SVB x 3 .   Today she reports no complaints.  Late care d/t transportation issues Last pap 2022. Results were: negative per pt report at office in [facility]     05/28/2023   11:55 AM  Depression screen PHQ 2/9  Decreased Interest 0  Down, Depressed, Hopeless 0  PHQ - 2 Score 0  Altered sleeping 0  Tired, decreased energy 1  Change in appetite 1  Feeling bad or failure about yourself  0  Trouble concentrating 0  Moving slowly or fidgety/restless 0  Suicidal thoughts 0  PHQ-9 Score 2        05/28/2023   11:55 AM  GAD 7 : Generalized Anxiety Score  Nervous, Anxious, on Edge 1  Control/stop worrying 0  Worry too much - different things 0  Trouble relaxing 1  Restless 0  Easily annoyed or irritable 1  Afraid - awful might happen 0  Total GAD 7 Score 3     Review of Systems:   Pertinent items are noted in HPI Denies cramping/contractions, leakage of fluid, vaginal bleeding, abnormal vaginal discharge w/ itching/odor/irritation, headaches, visual changes, shortness of breath, chest pain, abdominal pain, severe nausea/vomiting, or problems with urination or bowel movements unless otherwise stated above.  Pertinent History Reviewed:  Reviewed past medical,surgical, social, obstetrical and family history.  Reviewed problem list, medications and allergies. OB History  Gravida Para Term Preterm AB Living  5 3 3   1 3   SAB IAB Ectopic Multiple Live Births  1     0 3    # Outcome Date GA Lbr Len/2nd Weight  Sex Delivery Anes PTL Lv  5 Current           4 Term 07/20/21 [redacted]w[redacted]d  7 lb 9 oz (3.43 kg) F Vag-Spont EPI N LIV  3 Term 04/02/17 [redacted]w[redacted]d  7 lb 9 oz (3.43 kg) F Vag-Spont EPI N LIV  2 Term 02/10/15 [redacted]w[redacted]d 15:58 / 00:35 7 lb 9 oz (3.43 kg) M Vag-Spont EPI N LIV     Birth Comments: within normal limts  1 SAB 07/2013           Physical Assessment:   Vitals:   05/28/23 1132  BP: 113/74  Pulse: 97  Weight: 191 lb (86.6 kg)  Body mass index is 33.83 kg/m.       Physical Examination:  General appearance - well appearing, and in no distress  Mental status - alert, oriented to person, place, and time  Psych:  She has a normal mood and affect  Skin - warm and dry, normal color, no suspicious lesions noted  Chest - effort normal, all lung fields clear to auscultation bilaterally  Heart - normal rate and regular rhythm  Abdomen - soft, nontender  Extremities:  No swelling or varicosities noted  Thin prep pap is not done   Chaperone: N/A    TODAY'S FH: 29cm FHR via doppler: 151  No results found for this or any previous visit (from the past 24 hour(s)).  Assessment & Plan:  1) Low-Risk Pregnancy [id number] at [redacted]w[redacted]d with an Estimated Date of Delivery: 08/10/23   2) Initial OB visit  3) Late care  Meds: No orders of the defined types were placed in this encounter.   Initial labs obtained Continue prenatal vitamins Reviewed n/v relief measures and warning s/s to report Reviewed recommended weight gain based on pre-gravid BMI Encouraged well-balanced diet Genetic & carrier screening discussed: requests Panorama and Horizon , too late for NT/IT and AFP Ultrasound discussed; fetal survey: requested [facility]NC completed> form faxed if has or is planning to apply for medicaid The nature of [facility] [employer] with multiple MDs and other Advanced Practice Providers was explained to patient; also emphasized that fellows, residents, and students are part of our team. Does not  have home bp cuff. Office bp cuff given: yes. Rx sent: no. Check bp weekly, let us know if consistently >140/90.   Follow-up: Return for asap anatomy u/s & PN2 (no visit), then 2wks for LROB w/ CNM.   Orders Placed This Encounter  Procedures   Urine Culture   GC/Chlamydia Probe Amp   Glucose Tolerance, 2 Hours w/1 Hour   CBC/D/Plt+RPR+Rh+ABO+RubIgG...   HORIZON CUSTOM   [facility] PRENATAL TEST    Cheral Marker CNM, [facility] 05/28/2023 12:34 PM

## 2023-05-28 NOTE — Patient Instructions (Signed)
[place], thank you for choosing our office today! We appreciate the opportunity to meet your healthcare needs. You may receive a short survey by mail, e-mail, or through MyChart. If you are happy with your care we would appreciate if you could take just a few minutes to complete the survey questions. We read all of your comments and take your feedback very seriously. Thank you again for choosing our office.  [facility] ([address] [place], [place] [address]) Entrance C, located off of [address] Free 24/7 valet parking   CLASSES: Go to [email or web address] to register for classes (childbirth, breastfeeding, waterbirth, infant CPR, daddy bootcamp, etc.)  Call the office ([phone]) or go to [facility] if: You begin to have strong, frequent contractions Your water breaks.  Sometimes it is a big gush of fluid, sometimes it is just a trickle that keeps getting your panties wet or running down your legs You have vaginal bleeding.  It is normal to have a small amount of spotting if your cervix was checked.  You don't feel your baby moving like normal.  If you don't, get you something to eat and drink and lay down and focus on feeling your baby move.   If your baby is still not moving like normal, you should call the office or go to [facility].  Call the office ([phone]) or go to [facility] for these signs of pre-eclampsia: Severe headache that does not go away with Tylenol Visual changes- seeing spots, double, blurred vision Pain under your right breast or upper abdomen that does not go away with Tums or heartburn medicine Nausea and/or vomiting Severe swelling in your hands, feet, and face   Tdap Vaccine It is recommended that you get the Tdap vaccine during the third trimester of EACH pregnancy to help protect your baby from getting pertussis (whooping cough) 27-36 weeks is the BEST time to do  this so that you can pass the protection on to your baby. During pregnancy is better than after pregnancy, but if you are unable to get it during pregnancy it will be offered at the hospital.  You can get this vaccine with us, at the health department, your family doctor, or some local pharmacies Everyone who will be around your baby should also be up-to-date on their vaccines before the baby comes. Adults (who are not pregnant) only need 1 dose of Tdap during adulthood.   [place] Pediatricians/Family Doctors [facility] ([facility]): [address], [phone]           [facility]: [address], [phone]                [facility] ([facility]): [address], [phone] (call to ask if accepting patients) [facility]: [address], [place], [phone]    [place] Pediatricians/Family Doctors [facility] ([facility]): [address], [phone] [facility]: [address], [phone] [facility]: [address], [phone]  [place] [facility]  [facility] ([facility]): [phone] [facility]: [address], [phone]   [place] [facility]: [address], [phone]  [place] [facility]  [facility]: [address], [phone]  Home Blood Pressure Monitoring for Patients   Your provider has recommended that you check your   blood pressure (BP) at least once a week at home. If you do not have a blood pressure cuff at home, one will be provided for you. Contact your provider if you have not received your monitor within 1 week.   Helpful Tips for Accurate Home Blood Pressure Checks  Don't smoke, exercise, or drink caffeine 30 minutes before checking your BP Use the restroom before checking your BP (a full bladder can raise your  pressure) Relax in a comfortable upright chair Feet on the ground Left arm resting comfortably on a flat surface at the level of your heart Legs uncrossed Back supported Sit quietly and don't talk Place the cuff on your bare arm Adjust snuggly, so that only two fingertips can fit between your skin and the top of the cuff Check 2 readings separated by at least one minute Keep a log of your BP readings For a visual, please reference this diagram: http://ccnc.care/bpdiagram  Provider Name: [facility]     Phone: [phone]  Zone 1: ALL CLEAR  Continue to monitor your symptoms:  BP reading is less than 140 (top number) or less than 90 (bottom number)  No right upper stomach pain No headaches or seeing spots No feeling nauseated or throwing up No swelling in face and hands  Zone 2: CAUTION Call your doctor's office for any of the following:  BP reading is greater than 140 (top number) or greater than 90 (bottom number)  Stomach pain under your ribs in the middle or right side Headaches or seeing spots Feeling nauseated or throwing up Swelling in face and hands  Zone 3: EMERGENCY  Seek immediate medical care if you have any of the following:  BP reading is greater than160 (top number) or greater than 110 (bottom number) Severe headaches not improving with Tylenol Serious difficulty catching your breath Any worsening symptoms from Zone 2   Third Trimester of Pregnancy The third trimester is from week 29 through week 42, months 7 through 9. The third trimester is a time when the fetus is growing rapidly. At the end of the ninth month, the fetus is about 20 inches in length and weighs 6-10 pounds.  BODY CHANGES Your body goes through many changes during pregnancy. The changes vary from woman to woman.  Your weight will continue to increase. You can expect to gain 25-35 pounds (11-16 kg) by the end of the pregnancy. You may begin to get stretch marks on your hips, abdomen,  and breasts. You may urinate more often because the fetus is moving lower into your pelvis and pressing on your bladder. You may develop or continue to have heartburn as a result of your pregnancy. You may develop constipation because certain hormones are causing the muscles that push waste through your intestines to slow down. You may develop hemorrhoids or swollen, bulging veins (varicose veins). You may have pelvic pain because of the weight gain and pregnancy hormones relaxing your joints between the bones in your pelvis. Backaches may result from overexertion of the muscles supporting your posture. You may have changes in your hair. These can include thickening of your hair, rapid growth, and changes in texture. Some women also have hair loss during or after pregnancy, or hair that feels dry or thin. Your hair will most likely return to normal after your baby is born. Your breasts will continue to grow and be tender. A yellow discharge may leak from your breasts called colostrum. Your belly button may stick out. You may   feel short of breath because of your expanding uterus. You may notice the fetus "dropping," or moving lower in your abdomen. You may have a bloody mucus discharge. This usually occurs a few days to a week before labor begins. Your cervix becomes thin and soft (effaced) near your due date. WHAT TO EXPECT AT YOUR PRENATAL EXAMS  You will have prenatal exams every 2 weeks until week 36. Then, you will have weekly prenatal exams. During a routine prenatal visit: You will be weighed to make sure you and the fetus are growing normally. Your blood pressure is taken. Your abdomen will be measured to track your baby's growth. The fetal heartbeat will be listened to. Any test results from the previous visit will be discussed. You may have a cervical check near your due date to see if you have effaced. At around 36 weeks, your caregiver will check your cervix. At the same time, your  caregiver will also perform a test on the secretions of the vaginal tissue. This test is to determine if a type of bacteria, Group B streptococcus, is present. Your caregiver will explain this further. Your caregiver may ask you: What your birth plan is. How you are feeling. If you are feeling the baby move. If you have had any abnormal symptoms, such as leaking fluid, bleeding, severe headaches, or abdominal cramping. If you have any questions. Other tests or screenings that may be performed during your third trimester include: Blood tests that check for low iron levels (anemia). Fetal testing to check the health, activity level, and growth of the fetus. Testing is done if you have certain medical conditions or if there are problems during the pregnancy. FALSE LABOR You may feel small, irregular contractions that eventually go away. These are called Braxton Hicks contractions, or false labor. Contractions may last for hours, days, or even weeks before true labor sets in. If contractions come at regular intervals, intensify, or become painful, it is best to be seen by your caregiver.  SIGNS OF LABOR  Menstrual-like cramps. Contractions that are 5 minutes apart or less. Contractions that start on the top of the uterus and spread down to the lower abdomen and back. A sense of increased pelvic pressure or back pain. A watery or bloody mucus discharge that comes from the vagina. If you have any of these signs before the 37th week of pregnancy, call your caregiver right away. You need to go to the hospital to get checked immediately. HOME CARE INSTRUCTIONS  Avoid all smoking, herbs, alcohol, and unprescribed drugs. These chemicals affect the formation and growth of the baby. Follow your caregiver's instructions regarding medicine use. There are medicines that are either safe or unsafe to take during pregnancy. Exercise only as directed by your caregiver. Experiencing uterine cramps is a good sign to  stop exercising. Continue to eat regular, healthy meals. Wear a good support bra for breast tenderness. Do not use hot tubs, steam rooms, or saunas. Wear your seat belt at all times when driving. Avoid raw meat, uncooked cheese, cat litter boxes, and soil used by cats. These carry germs that can cause birth defects in the baby. Take your prenatal vitamins. Try taking a stool softener (if your caregiver approves) if you develop constipation. Eat more high-fiber foods, such as fresh vegetables or fruit and whole grains. Drink plenty of fluids to keep your urine clear or pale yellow. Take warm sitz baths to soothe any pain or discomfort caused by hemorrhoids. Use hemorrhoid cream if   your caregiver approves. If you develop varicose veins, wear support hose. Elevate your feet for 15 minutes, 3-4 times a day. Limit salt in your diet. Avoid heavy lifting, wear low heal shoes, and practice good posture. Rest a lot with your legs elevated if you have leg cramps or low back pain. Visit your dentist if you have not gone during your pregnancy. Use a soft toothbrush to brush your teeth and be gentle when you floss. A sexual relationship may be continued unless your caregiver directs you otherwise. Do not travel far distances unless it is absolutely necessary and only with the approval of your caregiver. Take prenatal classes to understand, practice, and ask questions about the labor and delivery. Make a trial run to the hospital. Pack your hospital bag. Prepare the baby's nursery. Continue to go to all your prenatal visits as directed by your caregiver. SEEK MEDICAL CARE IF: You are unsure if you are in labor or if your water has broken. You have dizziness. You have mild pelvic cramps, pelvic pressure, or nagging pain in your abdominal area. You have persistent nausea, vomiting, or diarrhea. You have a bad smelling vaginal discharge. You have pain with urination. SEEK IMMEDIATE MEDICAL CARE IF:  You  have a fever. You are leaking fluid from your vagina. You have spotting or bleeding from your vagina. You have severe abdominal cramping or pain. You have rapid weight loss or gain. You have shortness of breath with chest pain. You notice sudden or extreme swelling of your face, hands, ankles, feet, or legs. You have not felt your baby move in over an hour. You have severe headaches that do not go away with medicine. You have vision changes. Document Released: 12/04/2001 Document Revised: 12/15/2013 Document Reviewed: 02/10/2013 ExitCare Patient Information 2015 [employer]. This information is not intended to replace advice given to you by your health care provider. Make sure you discuss any questions you have with your health care provider.       

## 2023-06-03 LAB — URINE CULTURE

## 2023-06-03 LAB — GC/CHLAMYDIA PROBE AMP

## 2023-06-03 LAB — SPECIMEN STATUS REPORT

## 2023-06-04 ENCOUNTER — Telehealth: Payer: Self-pay

## 2023-06-04 LAB — PANORAMA PRENATAL TEST FULL PANEL:PANORAMA TEST PLUS 5 ADDITIONAL MICRODELETIONS: FETAL FRACTION: 16.2

## 2023-06-04 NOTE — Telephone Encounter (Signed)
Called patient yesterday about getting set up for her anatomy ultrasound. Left message on voicemail.

## 2023-06-04 NOTE — Telephone Encounter (Signed)
Called patient today to get ultrasound & sugar test scheduled. Left message on voicemail.

## 2023-06-06 LAB — HORIZON CUSTOM: REPORT SUMMARY: NEGATIVE

## 2023-06-11 ENCOUNTER — Ambulatory Visit (INDEPENDENT_AMBULATORY_CARE_PROVIDER_SITE_OTHER): Payer: 59 | Admitting: Women's Health

## 2023-06-11 ENCOUNTER — Encounter: Payer: Self-pay | Admitting: Women's Health

## 2023-06-11 VITALS — BP 112/77 | HR 94 | Wt 194.2 lb

## 2023-06-11 DIAGNOSIS — Z113 Encounter for screening for infections with a predominantly sexual mode of transmission: Secondary | ICD-10-CM

## 2023-06-11 DIAGNOSIS — Z3480 Encounter for supervision of other normal pregnancy, unspecified trimester: Secondary | ICD-10-CM

## 2023-06-11 DIAGNOSIS — Z3483 Encounter for supervision of other normal pregnancy, third trimester: Secondary | ICD-10-CM

## 2023-06-11 DIAGNOSIS — Z3A31 31 weeks gestation of pregnancy: Secondary | ICD-10-CM

## 2023-06-11 NOTE — Patient Instructions (Signed)
Alejandra Moody, thank you for choosing our office today! We appreciate the opportunity to meet your healthcare needs. You may receive a short survey by mail, e-mail, or through MyChart. If you are happy with your care we would appreciate if you could take just a few minutes to complete the survey questions. We read all of your comments and take your feedback very seriously. Thank you again for choosing our office.  °[facility] ° °[facility] °([address] [place], [place] [address]) °Entrance C, located off of [address] °Free 24/7 valet parking  ° °CLASSES: Go to [email or web address] to register for classes (childbirth, breastfeeding, waterbirth, infant CPR, daddy bootcamp, etc.) ° °Call the office ([phone]) or go to [facility] if: °You begin to have strong, frequent contractions °Your water breaks.  Sometimes it is a big gush of fluid, sometimes it is just a trickle that keeps getting your panties wet or running down your legs °You have vaginal bleeding.  It is normal to have a small amount of spotting if your cervix was checked.  °You don't feel your baby moving like normal.  If you don't, get you something to eat and drink and lay down and focus on feeling your baby move.   If your baby is still not moving like normal, you should call the office or go to [facility]. ° °Call the office ([phone]) or go to [facility] for these signs of pre-eclampsia: °Severe headache that does not go away with Tylenol °Visual changes- seeing spots, double, blurred vision °Pain under your right breast or upper abdomen that does not go away with Tums or heartburn medicine °Nausea and/or vomiting °Severe swelling in your hands, feet, and face  ° °Tdap Vaccine °It is recommended that you get the Tdap vaccine during the third trimester of EACH pregnancy to help protect your baby from getting pertussis (whooping cough) °27-36 weeks is the BEST time to do  this so that you can pass the protection on to your baby. During pregnancy is better than after pregnancy, but if you are unable to get it during pregnancy it will be offered at the hospital.  °You can get this vaccine with us, at the health department, your family doctor, or some local pharmacies °Everyone who will be around your baby should also be up-to-date on their vaccines before the baby comes. Adults (who are not pregnant) only need 1 dose of Tdap during adulthood.  ° °[place] Pediatricians/Family Doctors °[facility] ([facility]): [address], [phone]           °[facility]: [address], [phone]                °[facility] ([facility]): [address], [phone] (call to ask if accepting patients) °[facility]: [address], [place], [phone]   ° °[place] Pediatricians/Family Doctors °[facility] ([facility]): [address], [phone] °[facility]: [address], [phone] °[facility]: [address], [phone] ° °[place] [facility]  °[facility] ([facility]): [phone] °[facility]: [address], [phone]  ° °[place] [facility] °[facility]: [address], [phone] ° °[place] [facility]  °[facility]: [address], [phone] ° °Home Blood Pressure Monitoring for Patients  ° °Your provider has recommended that you check your   blood pressure (BP) at least once a week at home. If you do not have a blood pressure cuff at home, one will be provided for you. Contact your provider if you have not received your monitor within 1 week.  ° °Helpful Tips for Accurate Home Blood Pressure Checks  °Don't smoke, exercise, or drink caffeine 30 minutes before checking your BP °Use the restroom before checking your BP (a full bladder can raise your  pressure) °Relax in a comfortable upright chair °Feet on the ground °Left arm resting comfortably on a flat surface at the level of your heart °Legs uncrossed °Back supported °Sit quietly and don't talk °Place the cuff on your bare arm °Adjust snuggly, so that only two fingertips can fit between your skin and the top of the cuff °Check 2 readings separated by at least one minute °Keep a log of your BP readings °For a visual, please reference this diagram: http://ccnc.care/bpdiagram ° °Provider Name: [facility]     Phone: [phone] ° °Zone 1: ALL CLEAR  °Continue to monitor your symptoms:  °BP reading is less than 140 (top number) or less than 90 (bottom number)  °No right upper stomach pain °No headaches or seeing spots °No feeling nauseated or throwing up °No swelling in face and hands ° °Zone 2: CAUTION °Call your doctor's office for any of the following:  °BP reading is greater than 140 (top number) or greater than 90 (bottom number)  °Stomach pain under your ribs in the middle or right side °Headaches or seeing spots °Feeling nauseated or throwing up °Swelling in face and hands ° °Zone 3: EMERGENCY  °Seek immediate medical care if you have any of the following:  °BP reading is greater than160 (top number) or greater than 110 (bottom number) °Severe headaches not improving with Tylenol °Serious difficulty catching your breath °Any worsening symptoms from Zone 2  °Preterm Labor and Birth Information ° °The normal length of a pregnancy is 39-41 weeks. Preterm labor is when labor starts before 37 completed weeks of pregnancy. °What are the risk factors for preterm labor? °Preterm labor is more likely to occur in women who: °Have certain infections during pregnancy such as a bladder infection, sexually transmitted infection, or infection inside the uterus (chorioamnionitis). °Have a shorter-than-normal cervix. °Have gone into preterm labor before. °Have had surgery on their cervix. °Are younger than age 17  or older than age 35. °Are African American. °Are pregnant with twins or multiple babies (multiple gestation). °Take street drugs or smoke while pregnant. °Do not gain enough weight while pregnant. °Became pregnant shortly after having been pregnant. °What are the symptoms of preterm labor? °Symptoms of preterm labor include: °Cramps similar to those that can happen during a menstrual period. The cramps may happen with diarrhea. °Pain in the abdomen or lower back. °Regular uterine contractions that may feel like tightening of the abdomen. °A feeling of increased pressure in the pelvis. °Increased watery or bloody mucus discharge from the vagina. °Water breaking (ruptured amniotic sac). °Why is it important to recognize signs of preterm labor? °It is important to recognize signs of preterm labor because babies who are born prematurely may not be fully developed. This can put them at an increased risk for: °Long-term (chronic) heart and lung problems. °Difficulty immediately after birth with regulating body systems, including blood sugar, body temperature, heart rate, and breathing rate. °Bleeding in the brain. °Cerebral palsy. °Learning difficulties. °Death. °These risks are highest for babies who are born before 34 weeks   of pregnancy. °How is preterm labor treated? °Treatment depends on the length of your pregnancy, your condition, and the health of your baby. It may involve: °Having a stitch (suture) placed in your cervix to prevent your cervix from opening too early (cerclage). °Taking or being given medicines, such as: °Hormone medicines. These may be given early in pregnancy to help support the pregnancy. °Medicine to stop contractions. °Medicines to help mature the baby’s lungs. These may be prescribed if the risk of delivery is high. °Medicines to prevent your baby from developing cerebral palsy. °If the labor happens before 34 weeks of pregnancy, you may need to stay in the hospital. °What should I do if I  think I am in preterm labor? °If you think that you are going into preterm labor, call your health care provider right away. °How can I prevent preterm labor in future pregnancies? °To increase your chance of having a full-term pregnancy: °Do not use any tobacco products, such as cigarettes, chewing tobacco, and e-cigarettes. If you need help quitting, ask your health care provider. °Do not use street drugs or medicines that have not been prescribed to you during your pregnancy. °Talk with your health care provider before taking any herbal supplements, even if you have been taking them regularly. °Make sure you gain a healthy amount of weight during your pregnancy. °Watch for infection. If you think that you might have an infection, get it checked right away. °Make sure to tell your health care provider if you have gone into preterm labor before. °This information is not intended to replace advice given to you by your health care provider. Make sure you discuss any questions you have with your health care provider. °Document Revised: 04/03/2019 Document Reviewed: 05/02/2016 °Elsevier Patient Education © 2020 [employer] Inc[employer] ° ° °

## 2023-06-11 NOTE — Progress Notes (Signed)
LOW-RISK PREGNANCY VISIT Patient name: Alejandra Moody MRN [id number]  Date of birth: [birth year] Chief Complaint:   Routine Prenatal Visit  History of Present Illness:   Alejandra Moody is a 32 y.o. [phone] female at [redacted]w[redacted]d with an Estimated Date of Delivery: 08/10/23 being seen today for ongoing management of a low-risk pregnancy.   Today she reports no complaints. Contractions: Not present.  .  Movement: Present. denies leaking of fluid.     05/28/2023   11:55 AM  Depression screen PHQ 2/9  Decreased Interest 0  Down, Depressed, Hopeless 0  PHQ - 2 Score 0  Altered sleeping 0  Tired, decreased energy 1  Change in appetite 1  Feeling bad or failure about yourself  0  Trouble concentrating 0  Moving slowly or fidgety/restless 0  Suicidal thoughts 0  PHQ-9 Score 2        05/28/2023   11:55 AM  GAD 7 : Generalized Anxiety Score  Nervous, Anxious, on Edge 1  Control/stop worrying 0  Worry too much - different things 0  Trouble relaxing 1  Restless 0  Easily annoyed or irritable 1  Afraid - awful might happen 0  Total GAD 7 Score 3      Review of Systems:   Pertinent items are noted in HPI Denies abnormal vaginal discharge w/ itching/odor/irritation, headaches, visual changes, shortness of breath, chest pain, abdominal pain, severe nausea/vomiting, or problems with urination or bowel movements unless otherwise stated above. Pertinent History Reviewed:  Reviewed past medical,surgical, social, obstetrical and family history.  Reviewed problem list, medications and allergies. Physical Assessment:   Vitals:   06/11/23 1013  BP: 112/77  Pulse: 94  Weight: 194 lb 3.2 oz (88.1 kg)  Body mass index is 34.4 kg/m.        Physical Examination:   General appearance: Well appearing, and in no distress  Mental status: Alert, oriented to person, place, and time  Skin: Warm & dry  Cardiovascular: Normal heart rate noted  Respiratory: Normal respiratory effort, no  distress  Abdomen: Soft, gravid, nontender  Pelvic: Cervical exam deferred         Extremities: Edema: None  Fetal Status: Fetal Heart Rate (bpm): 147 Fundal Height: 31 cm Movement: Present    Chaperone: N/A   No results found for this or any previous visit (from the past 24 hour(s)).  Assessment & Plan:  1) Low-risk pregnancy [id number] at [redacted]w[redacted]d with an Estimated Date of Delivery: 08/10/23    Meds: No orders of the defined types were placed in this encounter.  Labs/procedures today:  needs pn1/GTT and anatomy u/s asap, was supposed to be scheduled last appt, but did not get scheduled.   GC/CT and urine cx today (was cancelled at new ob by [facility])  Plan:  Continue routine obstetrical care  Next visit: prefers in person    Reviewed: Preterm labor symptoms and general obstetric precautions including but not limited to vaginal bleeding, contractions, leaking of fluid and fetal movement were reviewed in detail with the patient.  All questions were answered. Does have home bp cuff. Office bp cuff given: not applicable. Check bp weekly, let us know if consistently >140 and/or >90.  Follow-up: Return for ASAP anat u/s (gbso if need), PN2 (no visit)-schedule PN2 even if can't get u/s scheduled; 2wk LROB.  Future Appointments  Date Time Provider Department Center  06/14/2023  9:10 AM [facility]    Orders Placed This Encounter  Procedures  GC/Chlamydia Probe Amp   Cheral Marker CNM, [facility] 06/11/2023 10:49 AM

## 2023-06-14 ENCOUNTER — Other Ambulatory Visit: Payer: 59

## 2023-06-14 LAB — URINE CULTURE

## 2023-06-15 LAB — CBC/D/PLT+RPR+RH+ABO+RUBIGG...
Antibody Screen: NEGATIVE
Basophils Absolute: 0 10*3/uL (ref 0.0–0.2)
Basos: 0 %
EOS (ABSOLUTE): 0 10*3/uL (ref 0.0–0.4)
Eos: 1 %
HCV Ab: NONREACTIVE
HIV Screen 4th Generation wRfx: NONREACTIVE
Hematocrit: 40.8 % (ref 34.0–46.6)
Hemoglobin: 13.4 g/dL (ref 11.1–15.9)
Hepatitis B Surface Ag: NEGATIVE
Immature Grans (Abs): 0 10*3/uL (ref 0.0–0.1)
Immature Granulocytes: 0 %
Lymphocytes Absolute: 1.5 10*3/uL (ref 0.7–3.1)
Lymphs: 18 %
MCH: 30.5 pg (ref 26.6–33.0)
MCHC: 32.8 g/dL (ref 31.5–35.7)
MCV: 93 fL (ref 79–97)
Monocytes Absolute: 0.6 10*3/uL (ref 0.1–0.9)
Monocytes: 7 %
Neutrophils Absolute: 6.1 10*3/uL (ref 1.4–7.0)
Neutrophils: 74 %
Platelets: 172 10*3/uL (ref 150–450)
RBC: 4.4 x10E6/uL (ref 3.77–5.28)
RDW: 12.5 % (ref 11.7–15.4)
RPR Ser Ql: NONREACTIVE
Rh Factor: NEGATIVE
Rubella Antibodies, IGG: 5.2 index (ref >0.99)
WBC: 8.3 10*3/uL (ref 3.4–10.8)

## 2023-06-15 LAB — HCV INTERPRETATION

## 2023-06-17 ENCOUNTER — Other Ambulatory Visit: Payer: 59

## 2023-06-17 DIAGNOSIS — Z3480 Encounter for supervision of other normal pregnancy, unspecified trimester: Secondary | ICD-10-CM

## 2023-06-17 DIAGNOSIS — Z131 Encounter for screening for diabetes mellitus: Secondary | ICD-10-CM

## 2023-06-17 DIAGNOSIS — Z3A32 32 weeks gestation of pregnancy: Secondary | ICD-10-CM

## 2023-06-18 LAB — GLUCOSE TOLERANCE, 2 HOURS W/ 1HR
Glucose, 1 hour: 109 mg/dL (ref 70–179)
Glucose, 2 hour: 89 mg/dL (ref 70–152)
Glucose, Fasting: 65 mg/dL — ABNORMAL LOW (ref 70–91)

## 2023-06-26 ENCOUNTER — Encounter: Payer: Self-pay | Admitting: Advanced Practice Midwife

## 2023-06-26 ENCOUNTER — Ambulatory Visit (INDEPENDENT_AMBULATORY_CARE_PROVIDER_SITE_OTHER): Payer: 59 | Admitting: Advanced Practice Midwife

## 2023-06-26 VITALS — BP 120/79 | HR 90 | Wt 195.0 lb

## 2023-06-26 DIAGNOSIS — Z3480 Encounter for supervision of other normal pregnancy, unspecified trimester: Secondary | ICD-10-CM

## 2023-06-26 DIAGNOSIS — Z3483 Encounter for supervision of other normal pregnancy, third trimester: Secondary | ICD-10-CM | POA: Diagnosis not present

## 2023-06-26 DIAGNOSIS — Z3A33 33 weeks gestation of pregnancy: Secondary | ICD-10-CM | POA: Diagnosis not present

## 2023-06-26 DIAGNOSIS — Z113 Encounter for screening for infections with a predominantly sexual mode of transmission: Secondary | ICD-10-CM

## 2023-06-26 NOTE — Progress Notes (Signed)
   LOW-RISK PREGNANCY VISIT Patient name: Alejandra Moody MRN [id number]  Date of birth: 1991-08-20 Chief Complaint:   Routine Prenatal Visit (Rhogam today)  History of Present Illness:   ALYNNA Moody is a 32 y.o. [phone] female at [redacted]w[redacted]d with an Estimated Date of Delivery: 08/10/23 being seen today for ongoing management of a low-risk pregnancy.  Today she reports no complaints. Contractions: Not present.  .  Movement: Present. denies leaking of fluid. Review of Systems:   Pertinent items are noted in HPI Denies abnormal vaginal discharge w/ itching/odor/irritation, headaches, visual changes, shortness of breath, chest pain, abdominal pain, severe nausea/vomiting, or problems with urination or bowel movements unless otherwise stated above. Pertinent History Reviewed:  Reviewed past medical,surgical, social, obstetrical and family history.  Reviewed problem list, medications and allergies. Physical Assessment:   Vitals:   06/26/23 1331  BP: 120/79  Pulse: 90  Weight: 195 lb (88.5 kg)  Body mass index is 34.54 kg/m.        Physical Examination:   General appearance: Well appearing, and in no distress  Mental status: Alert, oriented to person, place, and time  Skin: Warm & dry  Cardiovascular: Normal heart rate noted  Respiratory: Normal respiratory effort, no distress  Abdomen: Soft, gravid, nontender  Pelvic: Cervical exam deferred         Extremities: Edema: None  Fetal Status: Fetal Heart Rate (bpm): 157 Fundal Height: 33 cm Movement: Present    No results found for this or any previous visit (from the past 24 hour(s)).  Assessment & Plan:  1) Low-risk pregnancy [id number] at [redacted]w[redacted]d with an Estimated Date of Delivery: 08/10/23   2) Rh-, Rhogam given today  3) Late to care, has anatomy u/s 07/02/23   Meds: No orders of the defined types were placed in this encounter.  Labs/procedures today: GC/chlam (cancelled earlier); Rhogam  Plan:  Continue routine obstetrical  care   Reviewed: Preterm labor symptoms and general obstetric precautions including but not limited to vaginal bleeding, contractions, leaking of fluid and fetal movement were reviewed in detail with the patient.  All questions were answered. Has home bp cuff.  Check bp weekly, let us know if >140/90.   Follow-up: Return in about 2 weeks (around 07/10/2023) for LROB (GBS); please send ROI to [facility] again for Pap.  Orders Placed This Encounter  Procedures   GC/Chlamydia Probe Amp   RHO (D) Immune Globulin   Arabella Merles [facility] 06/26/2023 1:59 PM

## 2023-06-29 LAB — GC/CHLAMYDIA PROBE AMP
Chlamydia trachomatis, NAA: NEGATIVE
Neisseria Gonorrhoeae by PCR: NEGATIVE

## 2023-07-01 ENCOUNTER — Other Ambulatory Visit: Payer: Self-pay | Admitting: Women's Health

## 2023-07-01 DIAGNOSIS — Z363 Encounter for antenatal screening for malformations: Secondary | ICD-10-CM

## 2023-07-01 DIAGNOSIS — O0930 Supervision of pregnancy with insufficient antenatal care, unspecified trimester: Secondary | ICD-10-CM

## 2023-07-02 ENCOUNTER — Ambulatory Visit (INDEPENDENT_AMBULATORY_CARE_PROVIDER_SITE_OTHER): Payer: 59

## 2023-07-02 DIAGNOSIS — O0930 Supervision of pregnancy with insufficient antenatal care, unspecified trimester: Secondary | ICD-10-CM

## 2023-07-02 DIAGNOSIS — Z363 Encounter for antenatal screening for malformations: Secondary | ICD-10-CM | POA: Diagnosis not present

## 2023-07-02 DIAGNOSIS — O0933 Supervision of pregnancy with insufficient antenatal care, third trimester: Secondary | ICD-10-CM

## 2023-07-02 DIAGNOSIS — Z3A34 34 weeks gestation of pregnancy: Secondary | ICD-10-CM

## 2023-07-02 DIAGNOSIS — Z3480 Encounter for supervision of other normal pregnancy, unspecified trimester: Secondary | ICD-10-CM

## 2023-07-02 DIAGNOSIS — Z6791 Unspecified blood type, Rh negative: Secondary | ICD-10-CM

## 2023-07-02 DIAGNOSIS — O0932 Supervision of pregnancy with insufficient antenatal care, second trimester: Secondary | ICD-10-CM

## 2023-07-02 NOTE — Progress Notes (Signed)
[place] 34+3 wks,cephalic,posterior placenta gr 3,cx 4.8 cm,FHR 150 bpm,normal ovaries,AFI 16 cm,EFW 2278 g 28%,anatomy complete,limited because of fetal age

## 2023-07-12 ENCOUNTER — Ambulatory Visit (INDEPENDENT_AMBULATORY_CARE_PROVIDER_SITE_OTHER): Payer: 59 | Admitting: Obstetrics and Gynecology

## 2023-07-12 VITALS — BP 117/82 | HR 107 | Wt 195.6 lb

## 2023-07-12 DIAGNOSIS — Z3480 Encounter for supervision of other normal pregnancy, unspecified trimester: Secondary | ICD-10-CM

## 2023-07-12 DIAGNOSIS — Z3A35 35 weeks gestation of pregnancy: Secondary | ICD-10-CM

## 2023-07-12 DIAGNOSIS — O0933 Supervision of pregnancy with insufficient antenatal care, third trimester: Secondary | ICD-10-CM

## 2023-07-12 DIAGNOSIS — Z6791 Unspecified blood type, Rh negative: Secondary | ICD-10-CM

## 2023-07-12 DIAGNOSIS — O26899 Other specified pregnancy related conditions, unspecified trimester: Secondary | ICD-10-CM

## 2023-07-12 DIAGNOSIS — O26893 Other specified pregnancy related conditions, third trimester: Secondary | ICD-10-CM

## 2023-07-12 DIAGNOSIS — O0932 Supervision of pregnancy with insufficient antenatal care, second trimester: Secondary | ICD-10-CM

## 2023-07-12 NOTE — Progress Notes (Signed)
   PRENATAL VISIT NOTE  Subjective:  Alejandra Moody is a 32 y.o. [phone] at [redacted]w[redacted]d being seen today for ongoing prenatal care.  She is currently monitored for the following issues for this low-risk pregnancy and has Marijuana use; Rh negative state in antepartum period; Late prenatal care in second trimester; and Encounter for supervision of normal pregnancy, antepartum on their problem list.  Patient reports no complaints.  Contractions: Irritability. Vag. Bleeding: None.  Movement: Present. Denies leaking of fluid.   The following portions of the patient's history were reviewed and updated as appropriate: allergies, current medications, past family history, past medical history, past social history, past surgical history and problem list.   Objective:   Vitals:   07/12/23 0935 07/12/23 0941  BP: 135/61 117/82  Pulse: (!) 107   Weight: 195 lb 9.6 oz (88.7 kg)     Fetal Status: Fetal Heart Rate (bpm): 138 Fundal Height: 35 cm Movement: Present     General:  Alert, oriented and cooperative. Patient is in no acute distress.  Skin: Skin is warm and dry. No rash noted.   Cardiovascular: Normal heart rate noted  Respiratory: Normal respiratory effort, no problems with respiration noted  Abdomen: Soft, gravid, appropriate for gestational age.  Pain/Pressure: Absent     Pelvic: Cervical exam deferred        Extremities: Normal range of motion.     Mental Status: Normal mood and affect. Normal behavior. Normal judgment and thought content.   Assessment and Plan:  Pregnancy: [id number] at [redacted]w[redacted]d 1. Supervision of other normal pregnancy, antepartum BP and FHR normal Feeling regular fetal movement  2. Rh negative state in antepartum period Rhogam given 06/26/23  3. Late prenatal care in second trimester   4. [redacted] weeks gestation of pregnancy Up to date, no concers today Anticipatory guidance regarding 36 week swabs    Preterm labor symptoms and general obstetric precautions including  but not limited to vaginal bleeding, contractions, leaking of fluid and fetal movement were reviewed in detail with the patient. Please refer to After Visit Summary for other counseling recommendations.  Return in 1 week for routine prenatal care   Future Appointments  Date Time Provider Department Center  07/18/2023  2:50 PM Jacklyn Shell, CNM [facility]  07/25/2023 10:30 AM Jacklyn Shell, CNM [facility]  07/31/2023 10:10 AM Arabella Merles, CNM [facility]  08/08/2023 10:10 AM Marzella Schlein, Scarlette Calico, CNM [facility]    Albertine Grates, FNP

## 2023-07-13 ENCOUNTER — Encounter: Payer: Self-pay | Admitting: Obstetrics and Gynecology

## 2023-07-18 ENCOUNTER — Encounter: Payer: Self-pay | Admitting: Advanced Practice Midwife

## 2023-07-18 ENCOUNTER — Ambulatory Visit (INDEPENDENT_AMBULATORY_CARE_PROVIDER_SITE_OTHER): Payer: 59 | Admitting: Advanced Practice Midwife

## 2023-07-18 ENCOUNTER — Other Ambulatory Visit (HOSPITAL_COMMUNITY)
Admission: RE | Admit: 2023-07-18 | Discharge: 2023-07-18 | Disposition: A | Discharge status: Home / Self Care | Payer: 59 | Source: Ambulatory Visit | Attending: Advanced Practice Midwife | Admitting: Advanced Practice Midwife

## 2023-07-18 VITALS — BP 115/74 | HR 85 | Wt 195.0 lb

## 2023-07-18 DIAGNOSIS — Z3483 Encounter for supervision of other normal pregnancy, third trimester: Secondary | ICD-10-CM | POA: Diagnosis present

## 2023-07-18 DIAGNOSIS — Z3480 Encounter for supervision of other normal pregnancy, unspecified trimester: Secondary | ICD-10-CM

## 2023-07-18 DIAGNOSIS — Z3A36 36 weeks gestation of pregnancy: Secondary | ICD-10-CM

## 2023-07-18 NOTE — Progress Notes (Signed)
   LOW-RISK PREGNANCY VISIT Patient name: Alejandra Moody MRN [id number]  Date of birth: 1991-08-19 Chief Complaint:   Routine Prenatal Visit (culture)  History of Present Illness:   Alejandra Moody is a 32 y.o. [phone] female at [redacted]w[redacted]d with an Estimated Date of Delivery: 08/10/23 being seen today for ongoing management of a low-risk pregnancy.  Today she reports no complaints++++++++++ . Contractions: Not present.  .  Movement: Present. denies leaking of fluid. Review of Systems:   Pertinent items are noted in HPI Denies abnormal vaginal discharge w/ itching/odor/irritation, headaches, visual changes, shortness of breath, chest pain, abdominal pain, severe nausea/vomiting, or problems with urination or bowel movements unless otherwise stated above. Pertinent History Reviewed:  Reviewed past medical,surgical, social, obstetrical and family history.  Reviewed problem list, medications and allergies. Physical Assessment:   Vitals:   07/18/23 1509  BP: 115/74  Pulse: 85  Weight: 195 lb (88.5 kg)  Body mass index is 34.54 kg/m.        Physical Examination:   General appearance: Well appearing, and in no distress  Mental status: Alert, oriented to person, place, and time  Skin: Warm & dry  Cardiovascular: Normal heart rate noted  Respiratory: Normal respiratory effort, no distress  Abdomen: Soft, gravid, nontender  Pelvic: Cervical exam performed  Dilation: Fingertip Effacement (%): Thick Station: Ballotable  Extremities: Edema: None  Fetal Status: Fetal Heart Rate (bpm): 148 Fundal Height: 36 cm Movement: Present Presentation: Vertex  Chaperone:   Sherry RN     No results found for this or any previous visit (from the past 24 hour(s)).  Assessment & Plan:    Pregnancy: [id number] at [redacted]w[redacted]d 1. Supervision of other normal pregnancy, antepartum   2. [redacted] weeks gestation of pregnancy  - Culture, beta strep (group b only) - Cervicovaginal ancillary only( [place])      Meds: No orders of the defined types were placed in this encounter.  Labs/procedures today: GBS< GC, CHL  Plan:  Continue routine obstetrical care  Next visit: prefers in person    Reviewed: Term labor symptoms and general obstetric precautions including but not limited to vaginal bleeding, contractions, leaking of fluid and fetal movement were reviewed in detail with the patient.  All questions were answered. Has a home bp cuff. Check bp weekly, let us know if >140/90.   Follow-up: Return for As scheduled.  Future Appointments  Date Time Provider Department Center  07/25/2023 10:30 AM Jacklyn Shell, CNM [facility]  07/31/2023 10:10 AM Arabella Merles, CNM [facility]  08/08/2023 10:10 AM Marzella Schlein, Scarlette Calico, CNM [facility]    Orders Placed This Encounter  Procedures   Culture, beta strep (group b only)   Jacklyn Shell DNP, CNM 07/18/2023 3:33 PM

## 2023-07-18 NOTE — Patient Instructions (Signed)

## 2023-07-25 ENCOUNTER — Encounter: Payer: Self-pay | Admitting: Advanced Practice Midwife

## 2023-07-25 ENCOUNTER — Ambulatory Visit (INDEPENDENT_AMBULATORY_CARE_PROVIDER_SITE_OTHER): Payer: 59 | Admitting: Advanced Practice Midwife

## 2023-07-25 VITALS — BP 127/80 | HR 90 | Wt 191.0 lb

## 2023-07-25 DIAGNOSIS — Z3A37 37 weeks gestation of pregnancy: Secondary | ICD-10-CM

## 2023-07-25 DIAGNOSIS — Z3480 Encounter for supervision of other normal pregnancy, unspecified trimester: Secondary | ICD-10-CM

## 2023-07-25 DIAGNOSIS — Z3483 Encounter for supervision of other normal pregnancy, third trimester: Secondary | ICD-10-CM

## 2023-07-25 NOTE — Progress Notes (Signed)
   LOW-RISK PREGNANCY VISIT Patient name: Alejandra Moody MRN [id number]  Date of birth: [birth year] Chief Complaint:   Routine Prenatal Visit  History of Present Illness:   Alejandra Moody is a 32 y.o. [phone] female at [redacted]w[redacted]d with an Estimated Date of Delivery: 08/10/23 being seen today for ongoing management of a low-risk pregnancy.  Today she reports had some blurry vision for about 1/2 hour last night  Wasn't able to check BP. Marland Kitchen Contractions: Irregular.  .  Movement: Present. denies leaking of fluid. Review of Systems:   Pertinent items are noted in HPI Denies abnormal vaginal discharge w/ itching/odor/irritation, headaches, visual changes, shortness of breath, chest pain, abdominal pain, severe nausea/vomiting, or problems with urination or bowel movements unless otherwise stated above. Pertinent History Reviewed:  Reviewed past medical,surgical, social, obstetrical and family history.  Reviewed problem list, medications and allergies. Physical Assessment:   Vitals:   07/25/23 1026  BP: 127/80  Pulse: 90  Weight: 191 lb (86.6 kg)  Body mass index is 33.83 kg/m.        Physical Examination:   General appearance: Well appearing, and in no distress  Mental status: Alert, oriented to person, place, and time  Skin: Warm & dry  Cardiovascular: Normal heart rate noted  Respiratory: Normal respiratory effort, no distress  Abdomen: Soft, gravid, nontender  Pelvic: Cervical exam deferred         Extremities: Edema: None  Fetal Status:     Movement: Present    Chaperone:  N/A    No results found for this or any previous visit (from the past 24 hour(s)).  Assessment & Plan:    Pregnancy: [id number] at [redacted]w[redacted]d 1. Supervision of other normal pregnancy, antepartum   2. [redacted] weeks gestation of pregnancy      Meds: No orders of the defined types were placed in this encounter.  Labs/procedures today:   Plan:  Continue routine obstetrical care  Next visit: prefers in person     Reviewed: Term labor symptoms and general obstetric precautions including but not limited to vaginal bleeding, contractions, leaking of fluid and fetal movement were reviewed in detail with the patient.  All questions were answered. Has home bp cuff.. Check bp weekly, let us know if >140/90.   Follow-up: Return for As scheduled.  Future Appointments  Date Time Provider Department Center  07/31/2023 10:10 AM Arabella Merles, CNM [facility]  08/08/2023 10:10 AM Cresenzo-Dishmon, Scarlette Calico, CNM [facility]    No orders of the defined types were placed in this encounter.  Jacklyn Shell DNP, CNM 07/25/2023 10:45 AM

## 2023-07-25 NOTE — Patient Instructions (Signed)

## 2023-07-25 NOTE — Progress Notes (Deleted)
Patient ID: Alejandra Moody, female   DOB: June 20, 1991, 32 y.o.   MRN: [id number]   LOW-RISK PREGNANCY VISIT Patient name: Alejandra Moody MRN [id number]  Date of birth: 1991-10-25 Chief Complaint:   Routine Prenatal Visit  History of Present Illness:   Alejandra Moody is a 32 y.o. [id number] female at [redacted]w[redacted]d with an Estimated Date of Delivery: 08/10/23 being seen today for ongoing management of a low-risk pregnancy.  Today she reports {sx:14538}. Contractions: Irregular.  .  Movement: Present. {Actions; denies-reports:120008} leaking of fluid. Review of Systems:   Pertinent items are noted in HPI Denies abnormal vaginal discharge w/ itching/odor/irritation, headaches, visual changes, shortness of breath, chest pain, abdominal pain, severe nausea/vomiting, or problems with urination or bowel movements unless otherwise stated above. Pertinent History Reviewed:  Reviewed past medical,surgical, social, obstetrical and family history.  Reviewed problem list, medications and allergies. Physical Assessment:   Vitals:   07/25/23 1026  BP: 127/80  Pulse: 90  Weight: 191 lb (86.6 kg)  Body mass index is 33.83 kg/m.        Physical Examination:   General appearance: Well appearing, and in no distress  Mental status: Alert, oriented to person, place, and time  Skin: Warm & dry  Cardiovascular: Normal heart rate noted  Respiratory: Normal respiratory effort, no distress  Abdomen: Soft, gravid, nontender  Pelvic: {Blank single:19197::"Cervical exam performed","Cervical exam deferred"}         Extremities: Edema: None  Fetal Status:     Movement: Present    Chaperone:  {Chaperone:19197::"N/A","pt declined","Latisha Cresenzo","Janet Young","Amanda Andrews","Peggy Dones","Calandra Johnson"}    No results found for this or any previous visit (from the past 24 hour(s)).  Assessment & Plan:    Pregnancy: [id number] at [redacted]w[redacted]d 1. Supervision of other normal pregnancy, antepartum ***  2.  [redacted] weeks gestation of pregnancy ***     Meds: No orders of the defined types were placed in this encounter.  Labs/procedures today: ***  Plan:  Continue routine obstetrical care *** Next visit: prefers {Blank single:19197::"in person","online","will be in person for ***"}    Reviewed: {Blank single:19197::"Term","Preterm"} labor symptoms and general obstetric precautions including but not limited to vaginal bleeding, contractions, leaking of fluid and fetal movement were reviewed in detail with the patient.  All questions were answered. *** home bp cuff. Rx faxed to ***. Check bp weekly, let us know if >140/90.   Follow-up: No follow-ups on file.  Future Appointments  Date Time Provider Department Center  07/31/2023 10:10 AM Arabella Merles, CNM [facility]  08/08/2023 10:10 AM Cresenzo-Dishmon, Scarlette Calico, CNM [facility]    No orders of the defined types were placed in this encounter.  Jacklyn Shell DNP, CNM 07/25/2023 10:34 AM

## 2023-07-31 ENCOUNTER — Ambulatory Visit (INDEPENDENT_AMBULATORY_CARE_PROVIDER_SITE_OTHER): Payer: 59 | Admitting: Advanced Practice Midwife

## 2023-07-31 ENCOUNTER — Encounter: Payer: Self-pay | Admitting: Advanced Practice Midwife

## 2023-07-31 VITALS — BP 113/78 | HR 79 | Wt 194.5 lb

## 2023-07-31 DIAGNOSIS — Z3480 Encounter for supervision of other normal pregnancy, unspecified trimester: Secondary | ICD-10-CM

## 2023-07-31 DIAGNOSIS — Z3A38 38 weeks gestation of pregnancy: Secondary | ICD-10-CM

## 2023-07-31 NOTE — Patient Instructions (Signed)
Use the [email or web address]://glass.com/.com website to help baby get into a good position for labor!

## 2023-07-31 NOTE — Progress Notes (Signed)
   LOW-RISK PREGNANCY VISIT Patient name: Alejandra Moody MRN [id number]  Date of birth: 11-22-1991 Chief Complaint:   Routine Prenatal Visit  History of Present Illness:   Alejandra Moody is a 32 y.o. [id number] female at [redacted]w[redacted]d with an Estimated Date of Delivery: 08/10/23 being seen today for ongoing management of a low-risk pregnancy.  Today she reports no complaints. Contractions: Irregular (tightening). Vag. Bleeding: None.  Movement: Present. denies leaking of fluid. Review of Systems:   Pertinent items are noted in HPI Denies abnormal vaginal discharge w/ itching/odor/irritation, headaches, visual changes, shortness of breath, chest pain, abdominal pain, severe nausea/vomiting, or problems with urination or bowel movements unless otherwise stated above. Pertinent History Reviewed:  Reviewed past medical,surgical, social, obstetrical and family history.  Reviewed problem list, medications and allergies. Physical Assessment:   Vitals:   07/31/23 1015  BP: 113/78  Pulse: 79  Weight: 194 lb 8 oz (88.2 kg)  Body mass index is 34.45 kg/m.        Physical Examination:   General appearance: Well appearing, and in no distress  Mental status: Alert, oriented to person, place, and time  Skin: Warm & dry  Cardiovascular: Normal heart rate noted  Respiratory: Normal respiratory effort, no distress  Abdomen: Soft, gravid, nontender  Pelvic: Cervical exam performed  Dilation: 2 Effacement (%): Thick Station: -3  Extremities: Edema: None  Fetal Status: Fetal Heart Rate (bpm): 141 Fundal Height: 38 cm Movement: Present Presentation: Vertex  No results found for this or any previous visit (from the past 24 hour(s)).  Assessment & Plan:  1) Low-risk pregnancy [id number] at [redacted]w[redacted]d with an Estimated Date of Delivery: 08/10/23     Meds: No orders of the defined types were placed in this encounter.  Labs/procedures today: SVE  Plan:  Continue routine obstetrical care   Reviewed: Term  labor symptoms and general obstetric precautions including but not limited to vaginal bleeding, contractions, leaking of fluid and fetal movement were reviewed in detail with the patient.  All questions were answered. Has home bp cuff. Check bp weekly, let us know if >140/90.   Follow-up: Return for As scheduled.  No orders of the defined types were placed in this encounter.  Arabella Merles CNM 07/31/2023 10:41 AM

## 2023-08-08 ENCOUNTER — Ambulatory Visit (INDEPENDENT_AMBULATORY_CARE_PROVIDER_SITE_OTHER): Payer: 59 | Admitting: Advanced Practice Midwife

## 2023-08-08 ENCOUNTER — Encounter: Payer: Self-pay | Admitting: Advanced Practice Midwife

## 2023-08-08 VITALS — BP 112/80 | HR 73 | Wt 196.0 lb

## 2023-08-08 DIAGNOSIS — Z3480 Encounter for supervision of other normal pregnancy, unspecified trimester: Secondary | ICD-10-CM | POA: Diagnosis not present

## 2023-08-08 DIAGNOSIS — Z3A39 39 weeks gestation of pregnancy: Secondary | ICD-10-CM

## 2023-08-08 NOTE — Progress Notes (Signed)
    LOW-RISK PREGNANCY VISIT Patient name: Alejandra Moody MRN [id number]  Date of birth: 1990-12-27 Chief Complaint:   Routine Prenatal Visit (Cervical check)  History of Present Illness:   Alejandra Moody is a 32 y.o. [phone] female at [redacted]w[redacted]d with an Estimated Date of Delivery: 08/10/23 being seen today for ongoing management of a low-risk pregnancy.  Today she reports no complaints. Contractions: Irritability.  .  Movement: Present. denies leaking of fluid. Review of Systems:   Pertinent items are noted in HPI Denies abnormal vaginal discharge w/ itching/odor/irritation, headaches, visual changes, shortness of breath, chest pain, abdominal pain, severe nausea/vomiting, or problems with urination or bowel movements unless otherwise stated above. Pertinent History Reviewed:  Reviewed past medical,surgical, social, obstetrical and family history.  Reviewed problem list, medications and allergies. Physical Assessment:   Vitals:   08/08/23 1037  BP: 112/80  Pulse: 73  Weight: 196 lb (88.9 kg)  Body mass index is 34.72 kg/m.        Physical Examination:   General appearance: Well appearing, and in no distress  Mental status: Alert, oriented to person, place, and time  Skin: Warm & dry  Cardiovascular: Normal heart rate noted  Respiratory: Normal respiratory effort, no distress  Abdomen: Soft, gravid, nontender  Pelvic: Cervical exam performed  Dilation: 2 Effacement (%): 50 Station: -2  Extremities: Edema: None  Fetal Status: Fetal Heart Rate (bpm): 132 Fundal Height: 39 cm Movement: Present Presentation: Vertex  Chaperone:  Peggy Dones    No results found for this or any previous visit (from the past 24 hour(s)).  Assessment & Plan:    Pregnancy: [id number] at [redacted]w[redacted]d 1. [redacted] weeks gestation of pregnancy Membranes stripped per request  2. Supervision of other normal pregnancy, antepartum      Meds: No orders of the defined types were placed in this  encounter.  Labs/procedures today:   Plan:  Continue routine obstetrical care  Next visit: prefers in person    Reviewed: Term labor symptoms and general obstetric precautions including but not limited to vaginal bleeding, contractions, leaking of fluid and fetal movement were reviewed in detail with the patient.  All questions were answered. Has home bp cuff. Check bp weekly, let us know if >140/90.   Follow-up: Return in about 1 week (around 08/15/2023) for LROB and NST.  No future appointments.  No orders of the defined types were placed in this encounter.  Jacklyn Shell DNP, CNM 08/08/2023 10:57 AM

## 2023-08-08 NOTE — Patient Instructions (Signed)

## 2023-08-09 ENCOUNTER — Inpatient Hospital Stay (HOSPITAL_COMMUNITY)
Admission: AD | Admit: 2023-08-09 | Discharge: 2023-08-10 | DRG: 807 | Disposition: A | Discharge status: Home / Self Care | Payer: 59 | Attending: Obstetrics and Gynecology | Admitting: Obstetrics and Gynecology

## 2023-08-09 ENCOUNTER — Encounter (HOSPITAL_COMMUNITY): Payer: Self-pay | Admitting: Obstetrics and Gynecology

## 2023-08-09 ENCOUNTER — Other Ambulatory Visit: Payer: Self-pay

## 2023-08-09 DIAGNOSIS — Z6791 Unspecified blood type, Rh negative: Secondary | ICD-10-CM | POA: Diagnosis not present

## 2023-08-09 DIAGNOSIS — O26893 Other specified pregnancy related conditions, third trimester: Secondary | ICD-10-CM | POA: Diagnosis present

## 2023-08-09 DIAGNOSIS — Z3A39 39 weeks gestation of pregnancy: Secondary | ICD-10-CM

## 2023-08-09 DIAGNOSIS — Z23 Encounter for immunization: Secondary | ICD-10-CM

## 2023-08-09 DIAGNOSIS — Z87891 Personal history of nicotine dependence: Secondary | ICD-10-CM | POA: Diagnosis not present

## 2023-08-09 DIAGNOSIS — O0932 Supervision of pregnancy with insufficient antenatal care, second trimester: Secondary | ICD-10-CM

## 2023-08-09 DIAGNOSIS — O4202 Full-term premature rupture of membranes, onset of labor within 24 hours of rupture: Secondary | ICD-10-CM | POA: Diagnosis not present

## 2023-08-09 LAB — CBC
HCT: 40.7 % (ref 36.0–46.0)
Hemoglobin: 13.8 g/dL (ref 12.0–15.0)
MCH: 29.9 pg (ref 26.0–34.0)
MCHC: 33.9 g/dL (ref 30.0–36.0)
MCV: 88.3 fL (ref 80.0–100.0)
Platelets: 170 10*3/uL (ref 150–400)
RBC: 4.61 MIL/uL (ref 3.87–5.11)
RDW: 12.8 % (ref 11.5–15.5)
WBC: 14.3 10*3/uL — ABNORMAL HIGH (ref 4.0–10.5)
nRBC: 0 % (ref 0.0–0.2)

## 2023-08-09 LAB — RPR: RPR Ser Ql: NONREACTIVE

## 2023-08-09 LAB — TYPE AND SCREEN
ABO/RH(D): O NEG
Antibody Screen: POSITIVE

## 2023-08-09 MED ORDER — ONDANSETRON HCL 4 MG PO TABS: 4.0000 mg | ORAL_TABLET | ORAL | Status: DC | PRN

## 2023-08-09 MED ORDER — TRANEXAMIC ACID-NACL 1000-0.7 MG/100ML-% IV SOLN: 1000.0000 mg | INTRAVENOUS | Status: AC

## 2023-08-09 MED ORDER — DIPHENHYDRAMINE HCL 25 MG PO CAPS: 25.0000 mg | ORAL_CAPSULE | Freq: Four times a day (QID) | ORAL | Status: DC | PRN

## 2023-08-09 MED ORDER — BENZOCAINE-MENTHOL 20-0.5 % EX AERO
1.0000 | INHALATION_SPRAY | CUTANEOUS | Status: DC | PRN
  Administered 2023-08-10: 1 via TOPICAL
  Filled 2023-08-09: qty 56

## 2023-08-09 MED ORDER — EPHEDRINE 5 MG/ML INJ: 10.0000 mg | INTRAVENOUS | Status: DC | PRN

## 2023-08-09 MED ORDER — METHYLERGONOVINE MALEATE 0.2 MG/ML IJ SOLN: 0.2000 mg | INTRAMUSCULAR | Status: DC | PRN

## 2023-08-09 MED ORDER — MEDROXYPROGESTERONE ACETATE 150 MG/ML IM SUSP: 150.0000 mg | INTRAMUSCULAR | Status: DC | PRN

## 2023-08-09 MED ORDER — SENNOSIDES-DOCUSATE SODIUM 8.6-50 MG PO TABS
2.0000 | ORAL_TABLET | ORAL | Status: DC
  Administered 2023-08-09 – 2023-08-10 (×2): 2 via ORAL
  Filled 2023-08-09 (×2): qty 2

## 2023-08-09 MED ORDER — FLEET ENEMA RE ENEM: 1.0000 | ENEMA | RECTAL | Status: DC | PRN

## 2023-08-09 MED ORDER — COCONUT OIL OIL: 1.0000 | TOPICAL_OIL | Status: DC | PRN

## 2023-08-09 MED ORDER — SIMETHICONE 80 MG PO CHEW: 80.0000 mg | CHEWABLE_TABLET | ORAL | Status: DC | PRN

## 2023-08-09 MED ORDER — FERROUS SULFATE 325 (65 FE) MG PO TABS
325.0000 mg | ORAL_TABLET | ORAL | Status: DC
  Administered 2023-08-09: 325 mg via ORAL
  Filled 2023-08-09: qty 1

## 2023-08-09 MED ORDER — LACTATED RINGERS IV SOLN: 500.0000 mL | INTRAVENOUS | Status: DC | PRN

## 2023-08-09 MED ORDER — PHENYLEPHRINE 80 MCG/ML (10ML) SYRINGE FOR IV PUSH (FOR BLOOD PRESSURE SUPPORT): 80.0000 ug | PREFILLED_SYRINGE | INTRAVENOUS | Status: DC | PRN

## 2023-08-09 MED ORDER — ONDANSETRON HCL 4 MG/2ML IJ SOLN: 4.0000 mg | Freq: Four times a day (QID) | INTRAMUSCULAR | Status: DC | PRN

## 2023-08-09 MED ORDER — LACTATED RINGERS IV SOLN: 500.0000 mL | Freq: Once | INTRAVENOUS | Status: DC

## 2023-08-09 MED ORDER — OXYCODONE-ACETAMINOPHEN 5-325 MG PO TABS: 2.0000 | ORAL_TABLET | ORAL | Status: DC | PRN

## 2023-08-09 MED ORDER — BISACODYL 10 MG RE SUPP: 10.0000 mg | Freq: Every day | RECTAL | Status: DC | PRN

## 2023-08-09 MED ORDER — DIPHENHYDRAMINE HCL 50 MG/ML IJ SOLN: 12.5000 mg | INTRAMUSCULAR | Status: DC | PRN

## 2023-08-09 MED ORDER — LIDOCAINE HCL (PF) 1 % IJ SOLN: 30.0000 mL | INTRAMUSCULAR | Status: DC | PRN

## 2023-08-09 MED ORDER — PRENATAL MULTIVITAMIN CH
1.0000 | ORAL_TABLET | Freq: Every day | ORAL | Status: DC
  Administered 2023-08-09 – 2023-08-10 (×2): 1 via ORAL
  Filled 2023-08-09 (×2): qty 1

## 2023-08-09 MED ORDER — WITCH HAZEL-GLYCERIN EX PADS: 1.0000 | MEDICATED_PAD | CUTANEOUS | Status: DC | PRN

## 2023-08-09 MED ORDER — HYDROXYZINE HCL 50 MG PO TABS: 50.0000 mg | ORAL_TABLET | Freq: Four times a day (QID) | ORAL | Status: DC | PRN

## 2023-08-09 MED ORDER — FENTANYL CITRATE (PF) 100 MCG/2ML IJ SOLN: 100.0000 ug | INTRAMUSCULAR | Status: DC | PRN

## 2023-08-09 MED ORDER — OXYTOCIN-SODIUM CHLORIDE 30-0.9 UT/500ML-% IV SOLN
2.5000 [IU]/h | INTRAVENOUS | Status: DC
  Filled 2023-08-09: qty 500

## 2023-08-09 MED ORDER — ACETAMINOPHEN 325 MG PO TABS
650.0000 mg | ORAL_TABLET | ORAL | Status: DC | PRN
  Administered 2023-08-10: 650 mg via ORAL
  Filled 2023-08-09: qty 2

## 2023-08-09 MED ORDER — DIBUCAINE (PERIANAL) 1 % EX OINT: 1.0000 | TOPICAL_OINTMENT | CUTANEOUS | Status: DC | PRN

## 2023-08-09 MED ORDER — IBUPROFEN 600 MG PO TABS
600.0000 mg | ORAL_TABLET | Freq: Four times a day (QID) | ORAL | Status: DC
  Administered 2023-08-09 – 2023-08-10 (×5): 600 mg via ORAL
  Filled 2023-08-09 (×5): qty 1

## 2023-08-09 MED ORDER — SOD CITRATE-CITRIC ACID 500-334 MG/5ML PO SOLN: 30.0000 mL | ORAL | Status: DC | PRN

## 2023-08-09 MED ORDER — FENTANYL-BUPIVACAINE-NACL 0.5-0.125-0.9 MG/250ML-% EP SOLN: 12.0000 mL/h | EPIDURAL | Status: DC | PRN

## 2023-08-09 MED ORDER — METHYLERGONOVINE MALEATE 0.2 MG PO TABS: 0.2000 mg | ORAL_TABLET | ORAL | Status: DC | PRN

## 2023-08-09 MED ORDER — FLEET ENEMA RE ENEM: 1.0000 | ENEMA | Freq: Every day | RECTAL | Status: DC | PRN

## 2023-08-09 MED ORDER — TRANEXAMIC ACID-NACL 1000-0.7 MG/100ML-% IV SOLN
INTRAVENOUS | Status: AC
  Administered 2023-08-09: 1000 mg
  Filled 2023-08-09: qty 100

## 2023-08-09 MED ORDER — ONDANSETRON HCL 4 MG/2ML IJ SOLN: 4.0000 mg | INTRAMUSCULAR | Status: DC | PRN

## 2023-08-09 MED ORDER — LACTATED RINGERS IV SOLN: INTRAVENOUS | Status: DC

## 2023-08-09 MED ORDER — RHO D IMMUNE GLOBULIN 1500 UNIT/2ML IJ SOSY
300.0000 ug | PREFILLED_SYRINGE | Freq: Once | INTRAMUSCULAR | Status: AC
  Administered 2023-08-09: 300 ug via INTRAVENOUS
  Filled 2023-08-09: qty 2

## 2023-08-09 MED ORDER — OXYTOCIN BOLUS FROM INFUSION: 333.0000 mL | Freq: Once | INTRAVENOUS | Status: DC

## 2023-08-09 MED ORDER — TETANUS-DIPHTH-ACELL PERTUSSIS 5-2.5-18.5 LF-MCG/0.5 IM SUSY: 0.5000 mL | PREFILLED_SYRINGE | Freq: Once | INTRAMUSCULAR | Status: DC

## 2023-08-09 MED ORDER — MEASLES, MUMPS & RUBELLA VAC IJ SOLR: 0.5000 mL | Freq: Once | INTRAMUSCULAR | Status: DC

## 2023-08-09 MED ORDER — ACETAMINOPHEN 325 MG PO TABS: 650.0000 mg | ORAL_TABLET | ORAL | Status: DC | PRN

## 2023-08-09 MED ORDER — OXYCODONE-ACETAMINOPHEN 5-325 MG PO TABS: 1.0000 | ORAL_TABLET | ORAL | Status: DC | PRN

## 2023-08-09 NOTE — Discharge Summary (Signed)
Postpartum Discharge Summary  Date of Service updated***     Patient Name: YEKATERINA LARAMIE DOB: 21-Aug-1991 MRN: [id number]  Date of admission: 08/09/2023 Delivery date:08/09/2023 Delivering provider: Dia Sitter B Date of discharge: 08/09/2023  Admitting diagnosis: Indication for care in labor or delivery [O75.9] Intrauterine pregnancy: [redacted]w[redacted]d     Secondary diagnosis:  Principal Problem:   Indication for care in labor or delivery Active Problems:   Vaginal delivery  Additional problems: none    Discharge diagnosis: Term Pregnancy Delivered                                              Post partum procedures:rhogam Augmentation: AROM Complications: None  Hospital course: Onset of Labor With Vaginal Delivery      32 y.o. yo [id number] at [redacted]w[redacted]d was admitted in Active Labor on 08/09/2023. Labor course was complicated by nothing  Membrane Rupture Time/Date: 4:00 AM,08/09/2023  Delivery Method:Vaginal, Spontaneous Operative Delivery:N/A Episiotomy: None Lacerations:  None Patient had an uncomplicated postpartum course.  She is ambulating, tolerating a regular diet, passing flatus, and urinating well. Patient is discharged home in stable condition on 08/09/23.  Newborn Data: Birth date:08/09/2023 Birth time:8:41 AM Gender:Female Living status:Living Apgars:9 ,9  Weight:3147 g (6lb 15oz)  Magnesium Sulfate received: No BMZ received: No Rhophylac:Yes MMR:N/A T-DaP:Given prenatally Flu: N/A Transfusion:No  Physical exam  Vitals:   08/09/23 0947 08/09/23 1008 08/09/23 1126 08/09/23 1525  BP: (!) 113/58 115/64 113/73 109/70  Pulse: 74 68 69   Resp:  20 20 20   Temp:  98.3 F (36.8 C) 98.4 F (36.9 C) 98.5 F (36.9 C)  TempSrc:  Oral Oral Oral  SpO2:      Weight:      Height:       General: alert and cooperative Lochia: appropriate Uterine Fundus: firm Incision: N/A DVT Evaluation: No evidence of DVT seen on physical exam. Labs: Lab Results  Component Value  Date   WBC 14.3 (H) 08/09/2023   HGB 13.8 08/09/2023   HCT 40.7 08/09/2023   MCV 88.3 08/09/2023   PLT 170 08/09/2023      Latest Ref Rng & Units 04/08/2016    1:48 AM  CMP  Glucose 65 - 99 mg/dL 84   BUN 6 - 20 mg/dL 9   Creatinine 1.30 - 8.65 mg/dL 7.84   Sodium 696 - 295 mmol/L 144   Potassium 3.5 - 5.1 mmol/L 3.0   Chloride 101 - 111 mmol/L 109   CO2 22 - 32 mmol/L 24   Calcium 8.9 - 10.3 mg/dL 9.0   Total Protein 6.5 - 8.1 g/dL 8.0   Total Bilirubin 0.3 - 1.2 mg/dL 0.4   Alkaline Phos 38 - 126 U/L 64   AST 15 - 41 U/L 17   ALT 14 - 54 U/L 16    Edinburgh Score:    08/09/2023   10:08 AM  Edinburgh Postnatal Depression Scale Screening Tool  I have been able to laugh and see the funny side of things. --     After visit meds:  Allergies as of 08/09/2023   No Known Allergies   Med Rec must be completed prior to using this [facility]***        Discharge home in stable condition Infant Feeding: Breast Infant Disposition:home with mother Discharge instruction: per After Visit Summary and Postpartum booklet. Activity:  Advance as tolerated. Pelvic rest for 6 weeks.  Diet: routine diet Future Appointments: Future Appointments  Date Time Provider Department Center  08/15/2023 10:30 AM [facility] NURSE [facility]  08/15/2023 10:50 AM Cheral Marker, CNM [facility]   Follow up Visit:   Please schedule this patient for a In person postpartum visit in 4 weeks with the following provider: Any provider. Additional Postpartum F/U:  Low risk pregnancy complicated by:  Delivery mode:  Vaginal, Spontaneous Anticipated Birth Control:   vasectomy   08/09/2023 Arabella Merles, CNM

## 2023-08-09 NOTE — MAU Note (Signed)
.  Alejandra Moody is a 32 y.o. at [redacted]w[redacted]d here in MAU reporting ctxs and vag bleeding. States she was in the shower earlier this am and the bleeding was coming down her legs. Unsure if she has bled more but states she has not felt any bleeding while on way to the hospital. Reports good FM. No LOF. Was 2cm last sve  Onset of complaint: 0400 Pain score: 6 Vitals:   08/09/23 0641 08/09/23 0644  BP:  123/78  Pulse: 87   Resp: 17   Temp: 97.9 F (36.6 C)   SpO2: 98%      FHT:136 Lab orders placed from triage:  labor eval

## 2023-08-09 NOTE — H&P (Signed)
Alejandra Moody is a 32 y.o. female [phone] with IUP at [redacted]w[redacted]d by 14 weeks [place] presenting for labor.  Membranes stripped in office yesterday, started contracting.  Reports having bleeding/ROM this am. She reports positive fetal movement.  Prenatal History/Complications: PNC at [facility] Pregnancy complications:  - Past Medical History: Past Medical History:  Diagnosis Date   Psoriasis    Rh negative state in antepartum period 07/06/2014   O-  Rhogam after 28wks: 12/20/2014    Vaginal delivery 02/10/2015    Past Surgical History: Past Surgical History:  Procedure Laterality Date   TONSILLECTOMY AND ADENOIDECTOMY  2004    Obstetrical History: OB History     Gravida  5   Para  3   Term  3   Preterm      AB  1   Living  3      SAB  1   IAB      Ectopic      Multiple  0   Live Births  3            Social History: Social History   Socioeconomic History   Marital status: Married    Spouse name: Not on file   Number of children: Not on file   Years of education: Not on file   Highest education level: Not on file  Occupational History   Not on file  Tobacco Use   Smoking status: Former    Types: Cigarettes   Smokeless tobacco: Never  Vaping Use   Vaping status: Never Used  Substance and Sexual Activity   Alcohol use: Not Currently    Comment: Occas   Drug use: Not Currently    Types: Marijuana   Sexual activity: Yes    Birth control/protection: None  Other Topics Concern   Not on file  Social History Narrative   Not on file   Social Determinants of Health   Financial Resource Strain: Low Risk  (02/05/2023)   Overall Financial Resource Strain (CARDIA)    Difficulty of Paying Living Expenses: Not very hard  Food Insecurity: No Food Insecurity (08/09/2023)   Hunger Vital Sign    Worried About Running Out of Food in the Last Year: Never true    Ran Out of Food in the Last Year: Never true  Transportation Needs: No Transportation  Needs (08/09/2023)   PRAPARE - Administrator, Civil Service (Medical): No    Lack of Transportation (Non-Medical): No  Physical Activity: Sufficiently Active (02/05/2023)   Exercise Vital Sign    Days of Exercise per Week: 5 days    Minutes of Exercise per Session: 100 min  Stress: No Stress Concern Present (02/05/2023)   [employer] of Occupational Health - Occupational Stress Questionnaire    Feeling of Stress : Only a little  Social Connections: Moderately Integrated (02/05/2023)   Social Connection and Isolation Panel [NHANES]    Frequency of Communication with Friends and Family: Twice a week    Frequency of Social Gatherings with Friends and Family: Once a week    Attends Religious Services: 1 to 4 times per year    Active Member of [employer] or Organizations: No    Attends Engineer, structural: Never    Marital Status: Married    Family History: Family History  Problem Relation Age of Onset   Arthritis Maternal Grandmother    Diabetes Paternal Grandmother     Allergies: No Known Allergies  Medications Prior to  Admission  Medication Sig Dispense Refill Last Dose   Prenatal Vit-Fe Fumarate-FA (PRENATAL VITAMIN PO) Take by mouth.   08/08/2023    Review of Systems   Constitutional: Negative for fever and chills Eyes: Negative for visual disturbances Respiratory: Negative for shortness of breath, dyspnea Cardiovascular: Negative for chest pain or palpitations  Gastrointestinal: Negative for vomiting, diarrhea and constipation.  POSITIVE for abdominal pain (contractions) Genitourinary: Negative for dysuria and urgency Musculoskeletal: Negative for back pain, joint pain, myalgias  Neurological: Negative for dizziness and headaches  Blood pressure 123/78, pulse 87, temperature 97.9 F (36.6 C), resp. rate 17, height 5\' 4"  (1.626 m), weight 88.5 kg, last menstrual period 11/26/2022, SpO2 98%. General appearance: alert, cooperative, and no  distress Lungs: normal respiratory effort Heart: regular rate and rhythm Abdomen: soft, non-tender; bowel sounds normal Extremities: Homans sign is negative, no sign of DVT DTR's 2+ Presentation: cephalic Fetal monitoring  Baseline: 135 bpm, Variability: Good {> 6 bpm), Accelerations: Reactive, and Decelerations: Absent Uterine activity  2-3 minutes     Prenatal labs: ABO, Rh: O/Negative/-- (06/21 0845) Antibody: Negative (06/21 0845) Rubella: 5.20 (06/21 0845) RPR: Non Reactive (06/21 0845)  HBsAg: Negative (06/21 0845)  HIV: Non Reactive (06/21 0845)  GBS: Negative/-- (07/25 1400)   NURSING  PROVIDER  Office Location [facility] Dating by U/S at 14 wks  [facility] Model Traditional Anatomy U/S Normal boy  Initiated care at  [employer]  English              LAB RESULTS   Support Person Damon [facility] NIPS: LR female        AFP: too late               NT/IT (FT only) Too late    Carrier Screen Horizon: neg  Rhogam  06/25/33 A1C/GTT Third trimester: 65/109/89  Flu Vaccine N/a    TDaP Vaccine 05/28/23 Blood Type O/Negative/-- (06/21 0845)  Covid Vaccine  Antibody Negative (06/21 0845)    Rubella 5.20 (06/21 0845)  Feeding Plan breast RPR Non Reactive (06/21 0845)  Contraception Vasectomy, ?bridge HBsAg Negative (06/21 0845)  Circumcision yes HIV Non Reactive (06/21 0845)  Pediatrician  [facility] HCVAb Non Reactive (06/21 0845)  Prenatal Classes       Pap 2022 neg in [place]  BTLConsent  GC/CT 33wks: -/-  VBAC  Consent N/a GBS Negative/-- (07/25 1400) For PCN allergy, check sensitivities        DME Rx [ x] BP cuff [ ]  Weight Scale Waterbirth  [ ]  Class [ ]  Consent [ ]  CNM visit  PHQ9 & GAD7 [  ] new OB [ x ] 28 weeks  [  ] 36 weeks Induction  [ ]  Orders Entered [ ] Foley Y/N    Prenatal Transfer Tool  Maternal Diabetes: No Genetic Screening: Normal Maternal Ultrasounds/Referrals: Normal Fetal Ultrasounds or other Referrals:  None Maternal Substance  Abuse:  No Significant Maternal Medications:  None Significant Maternal Lab Results: Group B Strep negative  No results found for this or any previous visit (from the past 24 hour(s)).  Assessment: Alejandra Moody is a 32 y.o. [phone] with an IUP at [redacted]w[redacted]d presenting for labor/ROM  Plan: #Labor: expectant management #Pain:  Per request #FWB Cat 1 #ID: GBS: neg     Jacklyn Shell 08/09/2023, 7:26 AM

## 2023-08-10 LAB — RH IG WORKUP (INCLUDES ABO/RH)
Fetal Screen: NEGATIVE
Gestational Age(Wks): 39.6
Unit division: 0

## 2023-08-10 LAB — BIRTH TISSUE RECOVERY COLLECTION (PLACENTA DONATION)

## 2023-08-10 MED ORDER — IBUPROFEN 600 MG PO TABS: 600.0000 mg | ORAL_TABLET | Freq: Four times a day (QID) | ORAL | 0 refills | Status: DC | PRN

## 2023-08-10 NOTE — Lactation Note (Signed)
This note was copied from a baby's chart. Lactation Consultation Note  Patient Name: Alejandra Moody WGNFA'O Date: 08/10/2023 Age:31 hours Reason for consult: Initial assessment  P4, Mother hand expressed drops prior to latch. Provided pillows for support and suggest cross cradle to initially latch baby for head support and increased depth. After a few attempts, baby latched.   Feed on demand with cues.  Goal 8-12+ times per day after first 24 hrs.  Place baby STS if not cueing.  Mom made aware of O/P services, breastfeeding support group and our phone # for post-discharge questions.   Maternal Data Has patient been taught Hand Expression?: Yes Does the patient have breastfeeding experience prior to this delivery?: Yes How long did the patient breastfeed?: 10 mos.  Feeding Mother's Current Feeding Choice: Breast Milk  LATCH Score Latch: Grasps breast easily, tongue down, lips flanged, rhythmical sucking.  Audible Swallowing: A few with stimulation  Type of Nipple: Everted at rest and after stimulation  Comfort (Breast/Nipple): Soft / non-tender  Hold (Positioning): Assistance needed to correctly position infant at breast and maintain latch.  LATCH Score: 8   Lactation Tools Discussed/Used  Manual pump  Interventions Interventions: Assisted with latch;Hand express;Support pillows;Education;LC Services brochure  Discharge    Consult Status Consult Status: Follow-up Date: 08/11/23 Follow-up type: In-patient    [name] [facility] 08/10/2023, 8:17 AM

## 2023-08-15 ENCOUNTER — Other Ambulatory Visit: Payer: 59

## 2023-08-15 ENCOUNTER — Encounter: Payer: 59 | Admitting: Women's Health

## 2023-09-09 ENCOUNTER — Telehealth (HOSPITAL_COMMUNITY): Payer: Self-pay | Admitting: *Deleted

## 2023-09-09 NOTE — Telephone Encounter (Signed)
09/09/2023  Name: Alejandra Moody MRN: [id number] DOB: [birth year]  Reason for Call:  Transition of Care Hospital Discharge Call  Contact Status: Patient Contact Status: Unable to contact  Language assistant needed:          Follow-Up Questions:    [name] Postnatal Depression Scale:  In the Past 7 Days:    PHQ2-9 Depression Scale:     Discharge Follow-up:    Post-discharge interventions: NA  [name], RN 09/09/2023 13:45

## 2023-09-23 ENCOUNTER — Encounter: Payer: Self-pay | Admitting: Women's Health

## 2023-09-23 ENCOUNTER — Ambulatory Visit (INDEPENDENT_AMBULATORY_CARE_PROVIDER_SITE_OTHER): Payer: 59 | Admitting: Women's Health

## 2023-09-23 DIAGNOSIS — Z30011 Encounter for initial prescription of contraceptive pills: Secondary | ICD-10-CM | POA: Diagnosis not present

## 2023-09-23 MED ORDER — SLYND 4 MG PO TABS: 1.0000 | ORAL_TABLET | Freq: Every day | ORAL | 3 refills | Status: AC

## 2023-09-23 NOTE — Progress Notes (Signed)
POSTPARTUM VISIT Patient name: Alejandra Moody MRN [id number]  Date of birth: November 03, 1991 Chief Complaint:   Postpartum Care  History of Present Illness:   Alejandra Moody is a 32 y.o. [phone] Caucasian female being seen today for a postpartum visit. She is 6 weeks postpartum following a spontaneous vaginal delivery at 39.6 gestational weeks. IOL: no, for n/a. Anesthesia: none.  Laceration: none.  Complications: none. Inpatient contraception: no.   Pregnancy uncomplicated. Tobacco use: no. Substance use disorder: no. Last pap smear: 2022 and results were negative per pt report at office in [place] . Next pap smear due: 2025 No LMP recorded.  Postpartum course has been uncomplicated. Bleeding none. Bowel function is normal. Bladder function is normal. Urinary incontinence? no, fecal incontinence? no Last sexual activity:  did not discuss . Desired contraception: POPs then vasectomy. Patient does not want a pregnancy in the future.  Desired family size is 4 children.   Upstream - 09/23/23 1036       Pregnancy Intention Screening   Does the patient want to become pregnant in the next year? No    Does the patient's partner want to become pregnant in the next year? No    Would the patient like to discuss contraceptive options today? Yes      Contraception Wrap Up   Current Method Abstinence;Vasectomy    End Method Vasectomy;Oral Contraceptive    Contraception Counseling Provided Yes            The pregnancy intention screening data noted above was reviewed. Potential methods of contraception were discussed. The patient elected to proceed with Vasectomy; Oral Contraceptive.  Edinburgh Postpartum Depression Screening: negative  Edinburgh Postnatal Depression Scale - 09/23/23 1035       Edinburgh Postnatal Depression Scale:  In the Past 7 Days   I have been able to laugh and see the funny side of things. 0    I have looked forward with enjoyment to things. 0    I have  blamed myself unnecessarily when things went wrong. 1    I have been anxious or worried for no good reason. 2    I have felt scared or panicky for no good reason. 0    Things have been getting on top of me. 1    I have been so unhappy that I have had difficulty sleeping. 0    I have felt sad or miserable. 0    I have been so unhappy that I have been crying. 0    The thought of harming myself has occurred to me. 0    Edinburgh Postnatal Depression Scale Total 4                05/28/2023   11:55 AM  GAD 7 : Generalized Anxiety Score  Nervous, Anxious, on Edge 1  Control/stop worrying 0  Worry too much - different things 0  Trouble relaxing 1  Restless 0  Easily annoyed or irritable 1  Afraid - awful might happen 0  Total GAD 7 Score 3     Baby's course has been uncomplicated. Baby is feeding by breast and bottle: milk supply adequate. Infant has a pediatrician/family doctor? Yes.  Childcare strategy if returning to work/school: family.  Pt has material needs met for her and baby: Yes.   Review of Systems:   Pertinent items are noted in HPI Denies Abnormal vaginal discharge w/ itching/odor/irritation, headaches, visual changes, shortness of breath, chest pain, abdominal pain, severe nausea/vomiting, or  problems with urination or bowel movements. Pertinent History Reviewed:  Reviewed past medical,surgical, obstetrical and family history.  Reviewed problem list, medications and allergies. OB History  Gravida Para Term Preterm AB Living  5 4 4   1 4   SAB IAB Ectopic Multiple Live Births  1     0 4    # Outcome Date GA Lbr Len/2nd Weight Sex Type Anes PTL Lv  5 Term 08/09/23 [redacted]w[redacted]d  6 lb 15 oz (3.147 kg) M Vag-Spont None  LIV  4 Term 07/20/21 [redacted]w[redacted]d  7 lb 9 oz (3.43 kg) F Vag-Spont EPI N LIV  3 Term 04/02/17 [redacted]w[redacted]d  7 lb 9 oz (3.43 kg) F Vag-Spont EPI N LIV  2 Term 02/10/15 [redacted]w[redacted]d 15:58 / 00:35 7 lb 9 oz (3.43 kg) M Vag-Spont EPI N LIV     Birth Comments: within normal limts  1  SAB 07/2013           Physical Assessment:   Vitals:   09/23/23 1037  BP: 99/65  Pulse: 67  Weight: 181 lb (82.1 kg)  Height: 5\' 3"  (1.6 m)  Body mass index is 32.06 kg/m.       Physical Examination:   General appearance: alert, well appearing, and in no distress  Mental status: alert, oriented to person, place, and time  Skin: warm & dry   Cardiovascular: normal heart rate noted   Respiratory: normal respiratory effort, no distress   Breasts: deferred, no complaints   Abdomen: soft, non-tender   Pelvic: examination not indicated. Thin prep pap obtained: No  Rectal: not examined  Extremities: Edema: none   Chaperone: N/A         No results found for this or any previous visit (from the past 24 hour(s)).  Assessment & Plan:  1) Postpartum exam 2) 6 wks s/p spontaneous vaginal delivery 3) breast & bottle feeding 4) Depression screening 5) Contraception management: rx Slynd to [facility], condoms x 2wks, if any issues getting rx let us know  Essential components of care per ACOG recommendations:  1.  Mood and well being:  If positive depression screen, discussed and plan developed.  If using tobacco we discussed reduction/cessation and risk of relapse If current substance abuse, we discussed and referral to local resources was offered.   2. Infant care and feeding:  If breastfeeding, discussed returning to work, pumping, breastfeeding-associated pain, guidance regarding return to fertility while lactating if not using another method. If needed, patient was provided with a letter to be allowed to pump q 2-3hrs to support lactation in a private location with access to a refrigerator to store breastmilk.   Recommended that all caregivers be immunized for flu, pertussis and other preventable communicable diseases If pt does not have material needs met for her/baby, referred to local resources for help obtaining these.  3. Sexuality, contraception and birth spacing Provided  guidance regarding sexuality, management of dyspareunia, and resumption of intercourse Discussed avoiding interpregnancy interval <42mths and recommended birth spacing of 18 months  4. Sleep and fatigue Discussed coping options for fatigue and sleep disruption Encouraged family/partner/community support of 4 hrs of uninterrupted sleep to help with mood and fatigue  5. Physical recovery  If pt had a C/S, assessed incisional pain and providing guidance on normal vs prolonged recovery If pt had a laceration, perineal healing and pain reviewed.  If urinary or fecal incontinence, discussed management and referred to PT or uro/gyn if indicated  Patient is safe to resume physical activity.  Discussed attainment of healthy weight.  6.  Chronic disease management Discussed pregnancy complications if any, and their implications for future childbearing and long-term maternal health. Review recommendations for prevention of recurrent pregnancy complications, such as 17 hydroxyprogesterone caproate to reduce risk for recurrent PTB not applicable, or aspirin to reduce risk of preeclampsia not applicable. Pt had GDM: no. If yes, 2hr GTT scheduled: not applicable. Reviewed medications and non-pregnant dosing including consideration of whether pt is breastfeeding using a reliable resource such as [employer]: yes Referred for f/u w/ PCP or subspecialist providers as indicated: not applicable  7. Health maintenance Mammogram at 31yo or earlier if indicated Pap smears as indicated  Meds:  Meds ordered this encounter  Medications   Drospirenone (SLYND) 4 MG TABS    Sig: Take 1 tablet (4 mg total) by mouth daily.    Dispense:  90 tablet    Refill:  3    Follow-up: Return in about 1 year (around 09/22/2024) for Pap & physical, get last pap records please.   No orders of the defined types were placed in this encounter.   Cheral Marker CNM, WHNP-BC 09/23/2023 11:00 AM

## 2023-10-10 ENCOUNTER — Other Ambulatory Visit: Payer: Self-pay | Admitting: Women's Health
